# Patient Record
Sex: Female | Born: 1955 | Race: White | Hispanic: No | Marital: Married | Smoking: Former smoker
Health system: Southern US, Community
[De-identification: ages and names within clinical notes are randomized; demographics above are authoritative.]

## PROBLEM LIST (undated history)

## (undated) DIAGNOSIS — N83201 Unspecified ovarian cyst, right side: Secondary | ICD-10-CM

## (undated) DIAGNOSIS — I639 Cerebral infarction, unspecified: Secondary | ICD-10-CM

## (undated) DIAGNOSIS — K6389 Other specified diseases of intestine: Secondary | ICD-10-CM

## (undated) DIAGNOSIS — R202 Paresthesia of skin: Secondary | ICD-10-CM

## (undated) DIAGNOSIS — G459 Transient cerebral ischemic attack, unspecified: Secondary | ICD-10-CM

## (undated) DIAGNOSIS — T4145XA Adverse effect of unspecified anesthetic, initial encounter: Secondary | ICD-10-CM

## (undated) DIAGNOSIS — I10 Essential (primary) hypertension: Secondary | ICD-10-CM

## (undated) DIAGNOSIS — N83202 Unspecified ovarian cyst, left side: Secondary | ICD-10-CM

## (undated) DIAGNOSIS — T8859XA Other complications of anesthesia, initial encounter: Secondary | ICD-10-CM

## (undated) HISTORY — PX: OTHER SURGICAL HISTORY: SHX169

---

## 2004-09-11 ENCOUNTER — Emergency Department: Payer: Self-pay | Admitting: Emergency Medicine

## 2006-09-15 ENCOUNTER — Other Ambulatory Visit: Payer: Self-pay

## 2006-09-15 ENCOUNTER — Emergency Department: Payer: Self-pay | Admitting: General Practice

## 2008-07-06 ENCOUNTER — Emergency Department: Payer: Self-pay | Admitting: Emergency Medicine

## 2014-06-01 ENCOUNTER — Emergency Department: Payer: Self-pay | Admitting: Emergency Medicine

## 2014-06-01 LAB — CBC
HCT: 40.5 % (ref 35.0–47.0)
HGB: 13.7 g/dL (ref 12.0–16.0)
MCH: 28.4 pg (ref 26.0–34.0)
MCHC: 33.9 g/dL (ref 32.0–36.0)
MCV: 84 fL (ref 80–100)
Platelet: 195 10*3/uL (ref 150–440)
RBC: 4.82 10*6/uL (ref 3.80–5.20)
RDW: 15 % — AB (ref 11.5–14.5)
WBC: 5.6 10*3/uL (ref 3.6–11.0)

## 2014-06-01 LAB — URINALYSIS, COMPLETE
Bacteria: NONE SEEN
Bilirubin,UR: NEGATIVE
Blood: NEGATIVE
Glucose,UR: NEGATIVE mg/dL (ref 0–75)
Ketone: NEGATIVE
Leukocyte Esterase: NEGATIVE
NITRITE: NEGATIVE
Ph: 7 (ref 4.5–8.0)
Protein: NEGATIVE
RBC,UR: 1 /HPF (ref 0–5)
Specific Gravity: 1.009 (ref 1.003–1.030)
Squamous Epithelial: <1
WBC UR: <1 /HPF (ref 0–5)

## 2014-06-01 LAB — COMPREHENSIVE METABOLIC PANEL
Albumin: 3.6 g/dL (ref 3.4–5.0)
Alkaline Phosphatase: 87 U/L
Anion Gap: 8 (ref 7–16)
BILIRUBIN TOTAL: 0.8 mg/dL (ref 0.2–1.0)
BUN: 12 mg/dL (ref 7–18)
CO2: 26 mmol/L (ref 21–32)
Calcium, Total: 8.3 mg/dL — ABNORMAL LOW (ref 8.5–10.1)
Chloride: 107 mmol/L (ref 98–107)
Creatinine: 0.86 mg/dL (ref 0.60–1.30)
EGFR (Non-African Amer.): >60
Glucose: 89 mg/dL (ref 65–99)
OSMOLALITY: 280 (ref 275–301)
POTASSIUM: 3.9 mmol/L (ref 3.5–5.1)
SGOT(AST): 33 U/L (ref 15–37)
SGPT (ALT): 31 U/L
SODIUM: 141 mmol/L (ref 136–145)
Total Protein: 7.4 g/dL (ref 6.4–8.2)

## 2014-06-01 LAB — CK TOTAL AND CKMB (NOT AT ARMC)
CK, Total: 164 U/L (ref 26–192)
CK-MB: 1.9 ng/mL (ref 0.5–3.6)

## 2014-06-01 LAB — TROPONIN I: Troponin-I: <0.02 ng/mL

## 2015-06-09 DIAGNOSIS — I639 Cerebral infarction, unspecified: Secondary | ICD-10-CM

## 2015-06-09 DIAGNOSIS — G459 Transient cerebral ischemic attack, unspecified: Secondary | ICD-10-CM

## 2015-06-09 HISTORY — DX: Transient cerebral ischemic attack, unspecified: G45.9

## 2015-06-09 HISTORY — DX: Cerebral infarction, unspecified: I63.9

## 2015-07-09 ENCOUNTER — Encounter: Payer: Self-pay | Admitting: Emergency Medicine

## 2015-07-09 ENCOUNTER — Emergency Department
Admission: EM | Admit: 2015-07-09 | Discharge: 2015-07-09 | Disposition: A | Discharge status: Home / Self Care | Payer: Self-pay | Attending: Student | Admitting: Student

## 2015-07-09 ENCOUNTER — Emergency Department: Payer: Self-pay

## 2015-07-09 DIAGNOSIS — R1031 Right lower quadrant pain: Secondary | ICD-10-CM | POA: Insufficient documentation

## 2015-07-09 DIAGNOSIS — R1030 Lower abdominal pain, unspecified: Secondary | ICD-10-CM

## 2015-07-09 DIAGNOSIS — R197 Diarrhea, unspecified: Secondary | ICD-10-CM | POA: Insufficient documentation

## 2015-07-09 DIAGNOSIS — Z880 Allergy status to penicillin: Secondary | ICD-10-CM | POA: Insufficient documentation

## 2015-07-09 DIAGNOSIS — R102 Pelvic and perineal pain: Secondary | ICD-10-CM | POA: Insufficient documentation

## 2015-07-09 HISTORY — DX: Unspecified ovarian cyst, left side: N83.202

## 2015-07-09 HISTORY — DX: Unspecified ovarian cyst, right side: N83.201

## 2015-07-09 LAB — URINALYSIS COMPLETE WITH MICROSCOPIC (ARMC ONLY)
BILIRUBIN URINE: NEGATIVE
Bacteria, UA: NONE SEEN
Glucose, UA: NEGATIVE mg/dL
Hgb urine dipstick: NEGATIVE
Ketones, ur: NEGATIVE mg/dL
Leukocytes, UA: NEGATIVE
Nitrite: NEGATIVE
PH: 6 (ref 5.0–8.0)
PROTEIN: NEGATIVE mg/dL
Specific Gravity, Urine: 1.019 (ref 1.005–1.030)

## 2015-07-09 LAB — CBC
HCT: 43.3 % (ref 35.0–47.0)
Hemoglobin: 14.7 g/dL (ref 12.0–16.0)
MCH: 28 pg (ref 26.0–34.0)
MCHC: 34 g/dL (ref 32.0–36.0)
MCV: 82.2 fL (ref 80.0–100.0)
PLATELETS: 192 10*3/uL (ref 150–440)
RBC: 5.27 MIL/uL — AB (ref 3.80–5.20)
RDW: 14.9 % — AB (ref 11.5–14.5)
WBC: 5.5 10*3/uL (ref 3.6–11.0)

## 2015-07-09 LAB — COMPREHENSIVE METABOLIC PANEL
ALBUMIN: 4.4 g/dL (ref 3.5–5.0)
ALK PHOS: 78 U/L (ref 38–126)
ALT: 24 U/L (ref 14–54)
AST: 24 U/L (ref 15–41)
Anion gap: 7 (ref 5–15)
BUN: 15 mg/dL (ref 6–20)
CALCIUM: 9.5 mg/dL (ref 8.9–10.3)
CHLORIDE: 105 mmol/L (ref 101–111)
CO2: 27 mmol/L (ref 22–32)
CREATININE: 0.98 mg/dL (ref 0.44–1.00)
GFR calc Af Amer: >60 mL/min (ref >60)
GFR calc non Af Amer: >60 mL/min (ref >60)
GLUCOSE: 99 mg/dL (ref 65–99)
Potassium: 4 mmol/L (ref 3.5–5.1)
SODIUM: 139 mmol/L (ref 135–145)
Total Bilirubin: 1.1 mg/dL (ref 0.3–1.2)
Total Protein: 8 g/dL (ref 6.5–8.1)

## 2015-07-09 LAB — LIPASE, BLOOD: LIPASE: 31 U/L (ref 11–51)

## 2015-07-09 MED ORDER — IOHEXOL 240 MG/ML SOLN
25.0000 mL | Freq: Once | INTRAMUSCULAR | Status: AC | PRN
  Administered 2015-07-09: 25 mL via ORAL

## 2015-07-09 MED ORDER — IOHEXOL 300 MG/ML  SOLN
100.0000 mL | Freq: Once | INTRAMUSCULAR | Status: AC | PRN
  Administered 2015-07-09: 100 mL via INTRAVENOUS

## 2015-07-09 MED ORDER — SODIUM CHLORIDE 0.9 % IV BOLUS (SEPSIS)
500.0000 mL | Freq: Once | INTRAVENOUS | Status: AC
  Administered 2015-07-09: 500 mL via INTRAVENOUS

## 2015-07-09 NOTE — ED Notes (Signed)
Pt to ed with c/o right side lower abd pain, reports hx of cysts on ovaries,  States lower back pain and burning since Saturday.

## 2015-07-09 NOTE — ED Provider Notes (Signed)
[facility] Emergency Department Provider Note  ____________________________________________  Time seen: Approximately 11:20 AM  I have reviewed the triage vital signs and the nursing notes.   HISTORY  Chief Complaint Abdominal Pain    HPI Lindsay Schwartz is a 60 y.o. female with history of remote ovarian cyst and uterine fibroids who presents for evaluation of 3 days lower abdominal pain, worse in the right lower quadrant, constant since onset, gradual onset, currently moderate, no modifying factors. No nausea, vomiting or fevers. She has had multiple episodes of nonbloody nonbilious emesis. She is postmenopausal. No chest pain or difficulty breathing. She denies any abnormal vaginal bleeding or vaginal discharge. She denies any concerns for sexually transmitted infection.   Past Medical History  Diagnosis Date  . Cysts of both ovaries     There are no active problems to display for this patient.   History reviewed. No pertinent past surgical history.  No current outpatient prescriptions on file.  Allergies Penicillins  History reviewed. No pertinent family history.  Social History Social History  Substance Use Topics  . Smoking status: Never Smoker   . Smokeless tobacco: None  . Alcohol Use: No    Review of Systems Constitutional: No fever/chills Eyes: No visual changes. ENT: No sore throat. Cardiovascular: Denies chest pain. Respiratory: Denies shortness of breath. Gastrointestinal: + abdominal pain.  No nausea, no vomiting.  + diarrhea.  No constipation. Genitourinary: Negative for dysuria. Musculoskeletal: Negative for back pain. Skin: Negative for rash. Neurological: Negative for headaches, focal weakness or numbness.  10-point ROS otherwise negative.  ____________________________________________   PHYSICAL EXAM:  VITAL SIGNS: ED Triage Vitals  Enc Vitals Group     BP 07/09/15 0957 172/89 mmHg     Pulse Rate 07/09/15  0957 91     Resp 07/09/15 0957 20     Temp 07/09/15 0957 97.3 F (36.3 C)     Temp Source 07/09/15 0957 Oral     SpO2 07/09/15 0957 96 %     Weight 07/09/15 0957 180 lb (81.647 kg)     Height 07/09/15 0957 5\' 2"  (1.575 m)     Head Cir --      Peak Flow --      Pain Score 07/09/15 0958 4     Pain Loc --      Pain Edu? --      Excl. in [place]? --     Constitutional: Alert and oriented. Well appearing and in no acute distress. Eyes: Conjunctivae are normal. PERRL. EOMI. Head: Atraumatic. Nose: No congestion/rhinnorhea. Mouth/Throat: Mucous membranes are moist.  Oropharynx non-erythematous. Neck: No stridor.   Cardiovascular: Normal rate, regular rhythm. Grossly normal heart sounds.  Good peripheral circulation. Respiratory: Normal respiratory effort.  No retractions. Lungs CTAB. Gastrointestinal: Soft with mild to moderate tenderness in the lower abdomen, right greater than left. No CVA tenderness. Genitourinary: deferred Musculoskeletal: No lower extremity tenderness nor edema.  No joint effusions. Neurologic:  Normal speech and language. No gross focal neurologic deficits are appreciated. No gait instability. Skin:  Skin is warm, dry and intact. No rash noted. Psychiatric: Mood and affect are normal. Speech and behavior are normal.  ____________________________________________   LABS (all labs ordered are listed, but only abnormal results are displayed)  Labs Reviewed  CBC - Abnormal; Notable for the following:    RBC 5.27 (*)    RDW 14.9 (*)    All other components within normal limits  URINALYSIS COMPLETEWITH MICROSCOPIC ([facility] ONLY) - Abnormal; Notable  for the following:    Color, Urine YELLOW (*)    APPearance CLEAR (*)    Squamous Epithelial / LPF 0-5 (*)    All other components within normal limits  LIPASE, BLOOD  COMPREHENSIVE METABOLIC PANEL    ____________________________________________  EKG  none ____________________________________________  RADIOLOGY  CT abdomen and pelvis IMPRESSION: 1. A specific cause for the patient's acute right lower quadrant abdominal pain is not identified. The appendix is normal. Mobile cecum, right upper quadrant. 2. Lumbar spondylosis and degenerative disc disease, causing foraminal narrowing at L1-2, L4-5, and L5-S1 as detailed above. 3. Endometrial thickness 7 mm. In a postmenopausal female without vaginal bleeding this is considered within normal limits. The patient does have vaginal bleeding then this requires further workup by ultrasound. 4. Other imaging findings of potential clinical significance: Diffuse hepatic steatosis. Scarring at the right lung base. Bilateral renal atrophy is mildly advanced for age. Sigmoid diverticulosis without diverticulitis. Aortoiliac atherosclerotic vascular disease. ____________________________________________   PROCEDURES  Procedure(s) performed: None  Critical Care performed: No  ____________________________________________   INITIAL IMPRESSION / ASSESSMENT AND PLAN / ED COURSE  Pertinent labs & imaging results that were available during my care of the patient were reviewed by me and considered in my medical decision making (see chart for details).  Lindsay Schwartz is a 60 y.o. female with history of remote ovarian cyst and uterine fibroids who presents for evaluation of 3 days lower abdominal pain, worse in the right lower quadrant. On exam, she is nontoxic appearing and in no acute distress. Vital signs are stable, she is afebrile. She does have some tenderness throughout the lower abdomen, right greater than left. Plan for screening labs, CT of the abdomen and pelvis to rule out appendicitis. Given her well appearance and only mild tenderness on exam, doubt tubo-ovarian abscess or ovarian torsion. Reassess for disposition. She has  declined any pain medications at this time.  ----------------------------------------- 3:13 PM on 07/09/2015 ----------------------------------------- The patient continues to appear well, sitting up in bed, in no distress. No specific cause of the patient's abdominal pain found today on CT scan. Labs reviewed. CBC, CMP and lipase are generally unremarkable. Urinalysis is not consistent with urinary tract infection. We discussed extensive return precautions and need for close PCP and GYN follow-up. She is comfortable with the discharge plan. ____________________________________________   FINAL CLINICAL IMPRESSION(S) / ED DIAGNOSES  Final diagnoses:  Lower abdominal pain  Pelvic pain in female      Joanne Gavel, MD 07/09/15 1722

## 2015-10-09 ENCOUNTER — Other Ambulatory Visit: Payer: Self-pay | Admitting: Internal Medicine

## 2015-10-09 DIAGNOSIS — Z1239 Encounter for other screening for malignant neoplasm of breast: Secondary | ICD-10-CM

## 2015-10-09 DIAGNOSIS — R9431 Abnormal electrocardiogram [ECG] [EKG]: Secondary | ICD-10-CM | POA: Insufficient documentation

## 2015-10-09 DIAGNOSIS — R030 Elevated blood-pressure reading, without diagnosis of hypertension: Secondary | ICD-10-CM | POA: Insufficient documentation

## 2015-10-24 ENCOUNTER — Ambulatory Visit
Admission: RE | Admit: 2015-10-24 | Discharge: 2015-10-24 | Disposition: A | Discharge status: Home / Self Care | Payer: Self-pay | Source: Ambulatory Visit | Attending: Internal Medicine | Admitting: Internal Medicine

## 2015-10-24 ENCOUNTER — Other Ambulatory Visit: Payer: Self-pay | Admitting: Internal Medicine

## 2015-10-24 DIAGNOSIS — Z1239 Encounter for other screening for malignant neoplasm of breast: Secondary | ICD-10-CM

## 2015-10-27 ENCOUNTER — Emergency Department: Payer: Self-pay

## 2015-10-27 ENCOUNTER — Observation Stay
Admission: EM | Admit: 2015-10-27 | Discharge: 2015-10-28 | Disposition: A | Discharge status: Home / Self Care | Payer: Self-pay | Attending: Internal Medicine | Admitting: Internal Medicine

## 2015-10-27 ENCOUNTER — Observation Stay: Payer: Self-pay

## 2015-10-27 ENCOUNTER — Encounter: Payer: Self-pay | Admitting: Internal Medicine

## 2015-10-27 DIAGNOSIS — R6884 Jaw pain: Principal | ICD-10-CM | POA: Insufficient documentation

## 2015-10-27 DIAGNOSIS — R531 Weakness: Secondary | ICD-10-CM

## 2015-10-27 DIAGNOSIS — R5383 Other fatigue: Secondary | ICD-10-CM | POA: Insufficient documentation

## 2015-10-27 DIAGNOSIS — I639 Cerebral infarction, unspecified: Secondary | ICD-10-CM | POA: Diagnosis present

## 2015-10-27 DIAGNOSIS — Z803 Family history of malignant neoplasm of breast: Secondary | ICD-10-CM | POA: Insufficient documentation

## 2015-10-27 DIAGNOSIS — Z880 Allergy status to penicillin: Secondary | ICD-10-CM | POA: Insufficient documentation

## 2015-10-27 DIAGNOSIS — M6281 Muscle weakness (generalized): Secondary | ICD-10-CM | POA: Insufficient documentation

## 2015-10-27 DIAGNOSIS — I081 Rheumatic disorders of both mitral and tricuspid valves: Secondary | ICD-10-CM | POA: Insufficient documentation

## 2015-10-27 DIAGNOSIS — Z7982 Long term (current) use of aspirin: Secondary | ICD-10-CM | POA: Insufficient documentation

## 2015-10-27 DIAGNOSIS — Z8249 Family history of ischemic heart disease and other diseases of the circulatory system: Secondary | ICD-10-CM | POA: Insufficient documentation

## 2015-10-27 LAB — BASIC METABOLIC PANEL WITH GFR
Anion gap: 5 (ref 5–15)
BUN: 11 mg/dL (ref 6–20)
CO2: 26 mmol/L (ref 22–32)
Calcium: 8.9 mg/dL (ref 8.9–10.3)
Chloride: 107 mmol/L (ref 101–111)
Creatinine, Ser: 0.77 mg/dL (ref 0.44–1.00)
GFR calc Af Amer: >60 mL/min
GFR calc non Af Amer: >60 mL/min
Glucose, Bld: 111 mg/dL — ABNORMAL HIGH (ref 65–99)
Potassium: 4.2 mmol/L (ref 3.5–5.1)
Sodium: 138 mmol/L (ref 135–145)

## 2015-10-27 LAB — CBC
HCT: 43.3 % (ref 35.0–47.0)
Hemoglobin: 14.8 g/dL (ref 12.0–16.0)
MCH: 27.9 pg (ref 26.0–34.0)
MCHC: 34.3 g/dL (ref 32.0–36.0)
MCV: 81.4 fL (ref 80.0–100.0)
Platelets: 175 10*3/uL (ref 150–440)
RBC: 5.32 MIL/uL — ABNORMAL HIGH (ref 3.80–5.20)
RDW: 14.7 % — ABNORMAL HIGH (ref 11.5–14.5)
WBC: 4.5 10*3/uL (ref 3.6–11.0)

## 2015-10-27 LAB — TROPONIN I: Troponin I: <0.03 ng/mL

## 2015-10-27 MED ORDER — SODIUM CHLORIDE 0.9% FLUSH
3.0000 mL | Freq: Two times a day (BID) | INTRAVENOUS | Status: DC
  Administered 2015-10-27 – 2015-10-28 (×2): 3 mL via INTRAVENOUS

## 2015-10-27 MED ORDER — ENOXAPARIN SODIUM 40 MG/0.4ML ~~LOC~~ SOLN
40.0000 mg | SUBCUTANEOUS | Status: DC
  Filled 2015-10-27: qty 0.4

## 2015-10-27 MED ORDER — ONDANSETRON HCL 4 MG/2ML IJ SOLN
4.0000 mg | Freq: Four times a day (QID) | INTRAMUSCULAR | Status: DC | PRN
  Administered 2015-10-27: 22:00:00 4 mg via INTRAVENOUS
  Filled 2015-10-27 (×2): qty 2

## 2015-10-27 MED ORDER — ACETAMINOPHEN 325 MG PO TABS
650.0000 mg | ORAL_TABLET | Freq: Four times a day (QID) | ORAL | Status: DC | PRN
  Administered 2015-10-27 – 2015-10-28 (×2): 650 mg via ORAL
  Filled 2015-10-27 (×2): qty 2

## 2015-10-27 MED ORDER — ALBUTEROL SULFATE (2.5 MG/3ML) 0.083% IN NEBU: 2.5000 mg | INHALATION_SOLUTION | RESPIRATORY_TRACT | Status: DC | PRN

## 2015-10-27 MED ORDER — BISACODYL 5 MG PO TBEC: 5.0000 mg | DELAYED_RELEASE_TABLET | Freq: Every day | ORAL | Status: DC | PRN

## 2015-10-27 MED ORDER — POLYETHYLENE GLYCOL 3350 17 G PO PACK: 17.0000 g | PACK | Freq: Every day | ORAL | Status: DC | PRN

## 2015-10-27 MED ORDER — ASPIRIN 81 MG PO CHEW
324.0000 mg | CHEWABLE_TABLET | Freq: Once | ORAL | Status: AC
  Administered 2015-10-27: 19:00:00 324 mg via ORAL
  Filled 2015-10-27: qty 4

## 2015-10-27 MED ORDER — ACETAMINOPHEN 650 MG RE SUPP: 650.0000 mg | Freq: Four times a day (QID) | RECTAL | Status: DC | PRN

## 2015-10-27 MED ORDER — ONDANSETRON HCL 4 MG PO TABS: 4.0000 mg | ORAL_TABLET | Freq: Four times a day (QID) | ORAL | Status: DC | PRN

## 2015-10-27 MED ORDER — ATORVASTATIN CALCIUM 20 MG PO TABS: 40.0000 mg | ORAL_TABLET | Freq: Every day | ORAL | Status: DC

## 2015-10-27 MED ORDER — ASPIRIN EC 81 MG PO TBEC
81.0000 mg | DELAYED_RELEASE_TABLET | Freq: Every day | ORAL | Status: DC
  Administered 2015-10-28: 81 mg via ORAL
  Filled 2015-10-27: qty 1

## 2015-10-27 NOTE — H&P (Signed)
[place] [facility] [place] NAME: Lindsay Schwartz    MR#:  [id number]  DATE OF BIRTH:  1955/11/30  DATE OF ADMISSION:  10/27/2015  PRIMARY CARE PHYSICIAN: No PCP Per Patient   REQUESTING/REFERRING PHYSICIAN: Dr. Corky Downs  CHIEF COMPLAINT:   Chief Complaint  Patient presents with  . Jaw Pain    HISTORY OF PRESENT ILLNESS:  Lindsay Schwartz  is a 60 y.o. female with No significant past medical history presents to the emergency room complaining of feeling heaviness on her right side of the face and right upper extremity. Patient is very tearful on examination and initially mentioned that everything is okay and she wanted to go home. Later on further conversation she mentions that this started on waking up today morning. No dysphagia or change in vision. She has some tingling in her right arm. The main reason for presentation was concern for a heart attack. Patient examination had right sided weakness and had a CT scan of the head done which was normal and is being admitted to the hospital due to persistent right-sided weakness for further workup. She has been able to ambulate without any problem.  PAST MEDICAL HISTORY:   Past Medical History  Diagnosis Date  . Cysts of both ovaries     PAST SURGICAL HISTORY:  No past surgical history on file.  SOCIAL HISTORY:   Social History  Substance Use Topics  . Smoking status: Never Smoker   . Smokeless tobacco: Not on file  . Alcohol Use: No    FAMILY HISTORY:   Family History  Problem Relation Age of Onset  . Breast cancer Maternal Aunt 36  . CAD Father     DRUG ALLERGIES:   Allergies  Allergen Reactions  . Penicillins Rash    REVIEW OF SYSTEMS:   Review of Systems  Constitutional: Positive for malaise/fatigue. Negative for fever, chills and weight loss.  HENT: Negative for hearing loss and nosebleeds.   Eyes: Negative for blurred vision, double vision and pain.  Respiratory:  Negative for cough, hemoptysis, sputum production, shortness of breath and wheezing.   Cardiovascular: Negative for chest pain, palpitations, orthopnea and leg swelling.  Gastrointestinal: Negative for nausea, vomiting, abdominal pain, diarrhea and constipation.  Genitourinary: Negative for dysuria and hematuria.  Musculoskeletal: Negative for myalgias, back pain and falls.  Skin: Negative for rash.  Neurological: Positive for tingling and focal weakness. Negative for dizziness, tremors, sensory change, speech change, seizures and headaches.  Endo/Heme/Allergies: Does not bruise/bleed easily.  Psychiatric/Behavioral: Negative for depression and memory loss. The patient is nervous/anxious.     MEDICATIONS AT HOME:   Prior to Admission medications   Medication Sig Start Date End Date Taking? Authorizing Provider  aspirin EC 81 MG tablet Take 81 mg by mouth daily.   Yes Historical Provider, MD     VITAL SIGNS:  Blood pressure 138/86, pulse 74, temperature 98.3 F (36.8 C), temperature source Oral, resp. rate 16, height 5\' 3"  (1.6 m), weight 77.168 kg (170 lb 2 oz), SpO2 97 %.  PHYSICAL EXAMINATION:  Physical Exam  GENERAL:  60 y.o.-year-old patient lying in the bed with no acute distress. Anxious EYES: Pupils equal, round, reactive to light and accommodation. No scleral icterus. Extraocular muscles intact.  HEENT: Head atraumatic, normocephalic. Oropharynx and nasopharynx clear. No oropharyngeal erythema, moist oral mucosa  NECK:  Supple, no jugular venous distention. No thyroid enlargement, no tenderness.  LUNGS: Normal breath sounds bilaterally, no wheezing, rales, rhonchi. No  use of accessory muscles of respiration.  CARDIOVASCULAR: S1, S2 normal. No murmurs, rubs, or gallops.  ABDOMEN: Soft, nontender, nondistended. Bowel sounds present. No organomegaly or mass.  EXTREMITIES: No pedal edema, cyanosis, or clubbing. + 2 pedal & radial pulses b/l.   NEUROLOGIC: Cranial nerves II  through XII are intact. Sensations normal. Left sided upper and lower strength normal. Right upper extremity 4/5 and right lower extremity 4/5. Gait not tested. PSYCHIATRIC: The patient is alert and oriented x 3. Flat affect. Tearful. SKIN: No obvious rash, lesion, or ulcer.   LABORATORY PANEL:   CBC  Recent Labs Lab 10/27/15 1116  WBC 4.5  HGB 14.8  HCT 43.3  PLT 175   ------------------------------------------------------------------------------------------------------------------  Chemistries   Recent Labs Lab 10/27/15 1116  NA 138  K 4.2  CL 107  CO2 26  GLUCOSE 111*  BUN 11  CREATININE 0.77  CALCIUM 8.9   ------------------------------------------------------------------------------------------------------------------  Cardiac Enzymes  Recent Labs Lab 10/27/15 1116  TROPONINI <0.03   ------------------------------------------------------------------------------------------------------------------  RADIOLOGY:  Dg Chest 2 View  10/27/2015  CLINICAL DATA:  Pain. EXAM: CHEST  2 VIEW COMPARISON:  September 15, 2006 FINDINGS: The heart size is mildly enlarged. The hila and mediastinum are unremarkable. Vascular crowding again seen medial right lung base. No pulmonary nodules, masses, or suspicious infiltrates. No acute abnormalities are identified. IMPRESSION: No active cardiopulmonary disease. Electronically Signed   By: Dorise Bullion III M.D   On: 10/27/2015 12:20   Ct Head Wo Contrast  10/27/2015  CLINICAL DATA:  Neck/jaw pain/heaviness EXAM: CT HEAD WITHOUT CONTRAST TECHNIQUE: Contiguous axial images were obtained from the base of the skull through the vertex without intravenous contrast. COMPARISON:  09/12/2004 FINDINGS: No evidence of parenchymal hemorrhage or extra-axial fluid collection. No mass lesion, mass effect, or midline shift. No CT evidence of acute infarction. Cerebral volume is within normal limits.  No ventriculomegaly. The visualized paranasal sinuses  are essentially clear. The mastoid air cells are unopacified. No evidence of calvarial fracture. IMPRESSION: Normal head CT. Electronically Signed   By: Julian Hy M.D.   On: 10/27/2015 15:13     IMPRESSION AND PLAN:   * Right-sided weakness. Possible acute CVA Check MRI of the brain. Carotid Dopplers. Echocardiogram. Aspirin statin. PT, OT and speech evaluation. Patient has some labile weakness on repeated testing. Will need to consider psychiatry evaluation if her MRI is negative.  * Elevated blood pressure without diagnosis of hypertension Patient mentioned she has elevated blood pressure when she is stressed out. This is being followed by her primary care physician. Add medications if consistently elevated.  * DVT prophylaxis with Lovenox  All the records are reviewed and case discussed with ED provider. Management plans discussed with the patient, family and they are in agreement.  CODE STATUS: FULL CODE  TOTAL TIME TAKING CARE OF THIS PATIENT: 45 minutes.   Hillary Bow R M.D on 10/27/2015 at 4:30 PM  Between 7am to 6pm - Pager - [phone]  After 6pm go to [email or web address] - password [email or web address] [facility]  [place] [facility]  Office  [phone]  CC: Primary care physician; No PCP Per Patient  Note: This dictation was prepared with Dragon dictation along with smaller phrase technology. Any transcriptional errors that result from this process are unintentional.

## 2015-10-27 NOTE — ED Notes (Signed)
Patient transported to Ultrasound 

## 2015-10-27 NOTE — ED Notes (Addendum)
Pt states she woke up this morning not feeling right states her PCP has been working her up for "silent Heart attack" Pt states after she woke up the pain progressed in her Jaw, and pain in neck Right arm heaviness and tingling in the tips of her right fingers. Grips intact speech clear. Strength equal.

## 2015-10-27 NOTE — ED Provider Notes (Signed)
[facility] Emergency Department Provider Note  ____________________________________________    I have reviewed the triage vital signs and the nursing notes.   HISTORY  Chief Complaint Jaw Pain    HPI Lindsay Schwartz is a 60 y.o. female who presents with a complaint of right-sided jaw "heaviness" as well as right arm  "heaviness" that she noted when she woke up this morning. She complains of a mild frontal headache. She denies chest pain. She denies a history of similar complaints. Normal vision. She also complains of mild tingling in the fingertips of the right hand     Past Medical History  Diagnosis Date  . Cysts of both ovaries     There are no active problems to display for this patient.   No past surgical history on file.  No current outpatient prescriptions on file.  Allergies Penicillins  Family History  Problem Relation Age of Onset  . Breast cancer Maternal Aunt 58    Social History Social History  Substance Use Topics  . Smoking status: Never Smoker   . Smokeless tobacco: Not on file  . Alcohol Use: No    Review of Systems  Constitutional: Negative for fever. Eyes: Negative for redness ENT: Negative for sore throat Cardiovascular: Negative for chest pain Respiratory: Negative for shortness of breath. Gastrointestinal: Negative for abdominal pain Genitourinary: Negative for dysuria. Musculoskeletal: Negative for back pain. Skin: Negative for rash. Neurological: As above Psychiatric: no anxiety    ____________________________________________   PHYSICAL EXAM:  VITAL SIGNS: ED Triage Vitals  Enc Vitals Group     BP 10/27/15 1101 175/84 mmHg     Pulse Rate 10/27/15 1101 71     Resp 10/27/15 1101 20     Temp 10/27/15 1101 98.3 F (36.8 C)     Temp Source 10/27/15 1101 Oral     SpO2 10/27/15 1101 97 %     Weight 10/27/15 1101 170 lb 2 oz (77.168 kg)     Height 10/27/15 1101 5\' 3"  (1.6 m)     Head Cir --       Peak Flow --      Pain Score 10/27/15 1112 5     Pain Loc --      Pain Edu? --      Excl. in [place]? --      Constitutional: Alert and oriented. Well appearing and in no distress.  Eyes: Conjunctivae are normal. No erythema or injection ENT   Head: Normocephalic and atraumatic.   Mouth/Throat: Mucous membranes are moist. Cardiovascular: Normal rate, regular rhythm. Normal and symmetric distal pulses are present in the upper extremities.  Respiratory: Normal respiratory effort without tachypnea nor retractions. Breath sounds are clear and equal bilaterally.  Gastrointestinal: Soft and non-tender in all quadrants. No distention. There is no CVA tenderness. Genitourinary: deferred Musculoskeletal: Nontender with normal range of motion in all extremities. No lower extremity tenderness nor edema. Neurologic:  Normal speech and language. Patient with slight decrease in strength in the right arm as well as possibly in the right leg. EOMI, PERRLA Skin:  Skin is warm, dry and intact. No rash noted. Psychiatric: Mood and affect are normal. Patient exhibits appropriate insight and judgment.  ____________________________________________    LABS (pertinent positives/negatives)  Labs Reviewed  CBC - Abnormal; Notable for the following:    RBC 5.32 (*)    RDW 14.7 (*)    All other components within normal limits  BASIC METABOLIC PANEL - Abnormal; Notable for the following:  Glucose, Bld 111 (*)    All other components within normal limits  TROPONIN I    ____________________________________________   EKG  ED ECG REPORT I, Lavonia Drafts, the attending physician, personally viewed and interpreted this ECG.  Date: 10/27/2015 EKG Time: 11:07 AM Rate: 70 Rhythm: normal sinus rhythm QRS Axis: normal Intervals: normal ST/T Wave abnormalities: normal Conduction Disturbances: Left anterior fascicular block    ____________________________________________    RADIOLOGY  CT  head normal Chest x-ray is normal  ____________________________________________   PROCEDURES  Procedure(s) performed: none  Critical Care performed:none  ____________________________________________   INITIAL IMPRESSION / ASSESSMENT AND PLAN / ED COURSE  Pertinent labs & imaging results that were available during my care of the patient were reviewed by me and considered in my medical decision making (see chart for details).  Patient presents with complaints of discomfort in her jaw and "heaviness in her entire body" but primarily in the right arm. No chest pain. EKG is unremarkable, troponin is normal. Initial concern was more for possible CVA. Given description of heaviness we will order CT head and reevaluate  ----------------------------------------- 3:54 PM on 10/27/2015 -----------------------------------------  CT scan is reassuring, but the patient does have weakness of the right arm. I will discuss with hospitalist for admission ____________________________________________   FINAL CLINICAL IMPRESSION(S) / ED DIAGNOSES  CVA       Lavonia Drafts, MD 10/27/15 1556

## 2015-10-28 ENCOUNTER — Observation Stay: Payer: MEDICAID

## 2015-10-28 ENCOUNTER — Observation Stay
Admit: 2015-10-28 | Discharge: 2015-10-28 | Disposition: A | Discharge status: Home / Self Care | Payer: MEDICAID | Attending: Internal Medicine | Admitting: Internal Medicine

## 2015-10-28 DIAGNOSIS — G459 Transient cerebral ischemic attack, unspecified: Secondary | ICD-10-CM

## 2015-10-28 LAB — LIPID PANEL
CHOL/HDL RATIO: 7.4 ratio
Cholesterol: 207 mg/dL — ABNORMAL HIGH (ref 0–200)
HDL: 28 mg/dL — ABNORMAL LOW (ref >40)
LDL CALC: 135 mg/dL — AB (ref 0–99)
TRIGLYCERIDES: 221 mg/dL — AB (ref <150)
VLDL: 44 mg/dL — AB (ref 0–40)

## 2015-10-28 LAB — ECHOCARDIOGRAM COMPLETE
Height: 63 in
WEIGHTICAEL: 2699.2 [oz_av]

## 2015-10-28 LAB — HEMOGLOBIN A1C: Hgb A1c MFr Bld: 5.2 % (ref 4.0–6.0)

## 2015-10-28 MED ORDER — ATORVASTATIN CALCIUM 40 MG PO TABS: 40.0000 mg | ORAL_TABLET | Freq: Every day | ORAL | Status: DC

## 2015-10-28 MED ORDER — CLOPIDOGREL BISULFATE 75 MG PO TABS: 75.0000 mg | ORAL_TABLET | Freq: Every day | ORAL | Status: DC

## 2015-10-28 MED ORDER — METOCLOPRAMIDE HCL 5 MG/ML IJ SOLN
5.0000 mg | Freq: Once | INTRAMUSCULAR | Status: AC
Start: 2015-10-28 — End: 2015-10-28
  Administered 2015-10-28: 11:00:00 5 mg via INTRAVENOUS
  Filled 2015-10-28: qty 2

## 2015-10-28 NOTE — Evaluation (Signed)
Physical Therapy Evaluation Patient Details Name: Lindsay Schwartz MRN: [id number] DOB: 04/25/1956 Today's Date: 10/28/2015   History of Present Illness  Lindsay Schwartz is an 60 y.o. female who reports that she was feeling fatigued on Saturday and went to bed early. When she awakened on Sunday she noted a feeling of heaviness on her right face and arm. When she was drinking fluids came out of the right side of her mouth. She presented for evaluation at that time. 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  Pt's first husband died of a massive CVA.  CT and MRI of head negative.     Clinical Impression  Prior to admission, pt was independent ambulating without AD.  Pt lives with her husband on main floor of home with stairs to enter and stairs down to basement to use the dryer.  Pt initially stood and became very nauseas and needing to sit down (pt also c/o increased tingling R jaw but decreased with rest; nursing notified and came to see pt and cleared PT to ambulate with pt).  Pt able to ambulate around nursing loop but reported increased nausea with distance and became teary when PT attempting to decrease UE support (pt given B hand hold assist to steady d/t pt fearful of falling; unable to assess RW d/t pt too nauseas post ambulation to try to walk again).  Pt would benefit from skilled PT to address noted impairments and functional limitations.  Recommend pt discharge to home with 24/7 assist (anticipate pt will benefit from RW for stability) and HHPT when medically appropriate.     Follow Up Recommendations Home health PT;Supervision/Assistance - 24 hour    Equipment Recommendations  Rolling walker with 5" wheels    Recommendations for Other Services       Precautions / Restrictions Precautions Precautions:  Fall Restrictions Weight Bearing Restrictions: No      Mobility  Bed Mobility Overal bed mobility: Modified Independent Bed Mobility: Supine to Sit;Sit to Supine     Supine to sit: Modified independent (Device/Increase time);HOB elevated Sit to supine: Modified independent (Device/Increase time);HOB elevated      Transfers Overall transfer level: Needs assistance Equipment used: None Transfers: Sit to/from Stand Sit to Stand: Min guard         General transfer comment: x3 repetitions; pt holding onto counter upon standing for balance d/t concern for falling  Ambulation/Gait Ambulation/Gait assistance: Min guard;Min assist;+2 physical assistance;+2 safety/equipment Ambulation Distance (Feet): 220 Feet Assistive device: 2 person hand held assist   Gait velocity: decreased   General Gait Details: mild decreased stance time R LE; mild decreased L step length; attempted 1 person hand held assist x40 feet but pt became tearful so changed back to 2 person hand held assist d/t pt's concerns of falling  Stairs            Wheelchair Mobility    Modified Rankin (Stroke Patients Only)       Balance Overall balance assessment: Needs assistance Sitting-balance support: No upper extremity supported;Feet supported Sitting balance-Leahy Scale: Normal     Standing balance support: Single extremity supported Standing balance-Leahy Scale: Fair Standing balance comment: pt reporting concerns of falling  Pertinent Vitals/Pain Pain Assessment: 0-10 Pain Score: 5  Pain Location: headache Pain Descriptors / Indicators: Aching;Headache;Constant Pain Intervention(s): Limited activity within patient's tolerance;Monitored during session;Repositioned    Home Living Family/patient expects to be discharged to:: Private residence Living Arrangements: Spouse/significant other Available Help at Discharge: Family Type of Home: House Home  Access: Stairs to enter Entrance Stairs-Rails: Right Entrance Stairs-Number of Steps: 4 Home Layout: Two level (frequently goes to basement) Home Equipment: Shower seat;Grab bars - tub/shower;Walker - 2 wheels      Prior Function Level of Independence: Independent         Comments: Independent and active in community church and helping her mother and husband; denies any falls in past 6 months     Hand Dominance   Dominant Hand: Right    Extremity/Trunk Assessment   Upper Extremity Assessment: Defer to OT evaluation RUE Deficits / Details: speed slower on R and 5-/5 for strength but has full use of UE for ADLs and sensation and proprioception intact         Lower Extremity Assessment: RLE deficits/detail;LLE deficits/detail RLE Deficits / Details: R hip flexion 4/5; R knee extension 4+/5; R knee flexion 4/5; R DF 4+/5 LLE Deficits / Details: L LE strength and ROM WFL  Cervical / Trunk Assessment: Normal  Communication   Communication: No difficulties  Cognition Arousal/Alertness: Awake/alert Behavior During Therapy: WFL for tasks assessed/performed;Flat affect (tearful at times) Overall Cognitive Status: Within Functional Limits for tasks assessed                      General Comments  Pt agreeable to PT session.  Pt's husband and daughter present during session.    Exercises        Assessment/Plan    PT Assessment Patient needs continued PT services  PT Diagnosis Difficulty walking   PT Problem List Decreased strength;Decreased activity tolerance;Decreased balance;Decreased mobility;Decreased knowledge of use of DME;Impaired sensation  PT Treatment Interventions DME instruction;Gait training;Stair training;Functional mobility training;Therapeutic activities;Therapeutic exercise;Balance training;Neuromuscular re-education;Patient/family education   PT Goals (Current goals can be found in the Care Plan section) Acute Rehab PT Goals Patient Stated Goal:  to go home PT Goal Formulation: With patient Time For Goal Achievement: 11/11/15 Potential to Achieve Goals: Good    Frequency Min 2X/week   Barriers to discharge        Co-evaluation               End of Session Equipment Utilized During Treatment: Gait belt Activity Tolerance: Other (comment) (Limited d/t nausea with activity) Patient left: in bed;with call bell/phone within reach (with SLP present) Nurse Communication: Mobility status;Precautions         Time: [id number] PT Time Calculation (min) (ACUTE ONLY): 28 min   Charges:   PT Evaluation $PT Eval Moderate Complexity: 1 Procedure PT Treatments $Therapeutic Activity: 8-22 mins   PT G CodesLeitha Bleak 11-09-15, 2:04 PM Leitha Bleak, [place]

## 2015-10-28 NOTE — Progress Notes (Signed)
Orders received, chart reviewed and nursing consulted. Pt interviewed and appears at cognitive baseline with intelligible speech. Pt has very mild right facial weakness that doesn't impact PO consumption or speech intelligibility. Pt states that she compensates by placing food on her left when eating secondary to "right jaw feeling heavy." No pocketing of food noted. Education provided to pt, husband and daughter who are present as well as nursing. Skilled ST services are not indicated at this time. Nursing can reconsult ST if pt condition changes during hospitalization.

## 2015-10-28 NOTE — Progress Notes (Signed)
*  PRELIMINARY RESULTS* Echocardiogram 2D Echocardiogram has been performed.  Lindsay Schwartz 10/28/2015, 8:32 AM

## 2015-10-28 NOTE — Discharge Instructions (Signed)
Cardiac diet  Regular activity  You will need follow up labs now that you are on Lipitor. Your Primary doctor can order these tests.

## 2015-10-28 NOTE — Progress Notes (Signed)
Pt is alert and oriented x 4, c/o headache improved with tylenol, nausea improved with reglan, family at bedside, vitals stable, d/c to home with recommendations to follow up with outpatient physical therapy. Pt has follow up appointment with Dr. Candiss Norse and advised to follow up with neurologist, Dr. Manuella Ghazi. Contact information provided to patient for Dr. Manuella Ghazi. Rx sent to pharmacy, pt will be on aspirin and atorvastatin. Pt reports understanding of d/c instructions and has no further questions at this time, d/c via husband, pushed to visitor entrance via nursing staff.

## 2015-10-28 NOTE — Care Management (Signed)
Admitted to [facility] under observation status with the diagnosis of CVA. Lives with the husband in [place]. Will be seeing Dr. Glendon Axe at [facility] June 2. States that she has no income and no insurance. States she has a rolling walker in the home. States she takes no prescription medications. No home Health. No skilled facility.  Discharge to home today per Dr. Darvin Neighbours.   Family will transport Lindsay Ammons RN MSN CCM Care Management [phone]

## 2015-10-28 NOTE — Evaluation (Signed)
Occupational Therapy Evaluation Patient Details Name: Lindsay Schwartz MRN: [id number] DOB: [birth year] Today's Date: 10/28/2015    History of Present Illness Lindsay Schwartz is an 60 y.o. female who reports that she was feeling fatigued on Saturday and went to bed early. When she awakened on Sunday she noted a feeling of heaviness on her right face and arm. When she was drinking fluids came out of the right side of her mouth. She presented for evaluation at that time. 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  Her step daughter was present and indicated that she needed to slow down and not do so much for everyone else. Pt's first husband died of a massive CVA.    Clinical Impression   Pt seen for OT evaluation with several family members in room with her and her Doristine Bosworth.  She was feeling nauseated and had received meds vis IV to help with this from [place].  Pt is able to ambulate to bathroom with supervision but has fear of falling and her daughter reported that she has her mother put her arms up around her shoulders and walk behind her.  PT gave this therapist a walker to try with patient but she was too anxious to try to ambulate again and asked to try later after lunch which was conveyed to PT.  She presents at baseline for ADLs and has decreased strength of 5-/5 in RUE with mild delay in fine motor skills but is able to complete ADLs without assistance.  Rec pt use RUE as much as possible and not ignore it like she has been doing when tingling is present.  MRI and CT negative.  Pt is not in need of any further OT and reviewed pacing and energy conservation tech to use during the day and allow rest breaks.  Pt presented with flat affect and tearful at times during the evaluation.       Follow Up Recommendations  No OT follow up    Equipment  Recommendations       Recommendations for Other Services       Precautions / Restrictions Precautions Precautions: Fall (family indicated she has been getting up on her own--will clarify with NSG) Restrictions Weight Bearing Restrictions: No      Mobility Bed Mobility                  Transfers                      Balance                                            ADL Overall ADL's : At baseline                                       General ADL Comments: Pt is fearful of falling but is able to complete all ADLs as she did prior to admission.  Supervision for safety only.       Vision     Perception     Praxis      Pertinent Vitals/Pain Pain Assessment:  0-10 Pain Score: 6  Pain Location: headache Pain Descriptors / Indicators: Aching;Constant Pain Intervention(s): Limited activity within patient's tolerance;Premedicated before session;Monitored during session     Hand Dominance Right   Extremity/Trunk Assessment Upper Extremity Assessment Upper Extremity Assessment: RUE deficits/detail RUE Deficits / Details: speed slower on R and 5-/5 for strength but has full use of UE for ADLs and sensation and proprioception intact RUE Coordination: decreased fine motor (gave verbal exercises to do at home and encouraged her to use it as much as possible for ADLs)   Lower Extremity Assessment Lower Extremity Assessment: Defer to PT evaluation       Communication Communication Communication: No difficulties   Cognition Arousal/Alertness: Awake/alert Behavior During Therapy: WFL for tasks assessed/performed;Flat affect (weeping at times) Overall Cognitive Status: Within Functional Limits for tasks assessed                     General Comments       Exercises       Shoulder Instructions      Home Living Family/patient expects to be discharged to:: Private residence Living Arrangements:  Spouse/significant other Available Help at Discharge: Family Type of Home: House       Home Layout: One level     Bathroom Shower/Tub: Tub/shower unit;Walk-in shower (has both and her husband has a grab bar with shower chair with back and grab bar in his shower stall wihich is rec) Shower/tub characteristics: Architectural technologist: Standard     Home Equipment: Shower seat;Grab bars - tub/shower          Prior Functioning/Environment Level of Independence: Independent        Comments: independent and active in [employer] and helping her mother and husband    OT Diagnosis:     OT Problem List:     OT Treatment/Interventions:      OT Goals(Current goals can be found in the care plan section)    OT Frequency:     Barriers to D/C:            Co-evaluation              End of Session    Activity Tolerance: Patient tolerated treatment well Patient left: in bed;with family/visitor present   Time: 1120-1155 OT Time Calculation (min): 35 min Charges:  OT General Charges $OT Visit: 1 Procedure OT Evaluation $OT Eval Moderate Complexity: 1 Procedure G-Codes: OT G-codes **NOT FOR INPATIENT CLASS** Functional Limitation: Self care Self Care Current Status [id number]): 0 percent impaired, limited or restricted Self Care Goal Status [id number]): 0 percent impaired, limited or restricted   Chrys Racer, OTR/L ascom [phone]  10/28/2015, 12:42 PM

## 2015-10-28 NOTE — Consult Note (Signed)
Referring Physician: Sudini    Chief Complaint: Right sided numbness and weakness  HPI: Lindsay Schwartz is an 60 y.o. female 2ho reports that she was feeling fatigued on Saturday and went to bed early.  When she awakened on Sunday she noted a feeling of heaviness on her right face and arm.  When she was drinking fluids came out of the right side of her mouth.  She presented for evaluation at that time.  Initial NIHSS of 3.    Date last known well: 10/26/2015 Time last known well: Time: 22:00 tPA Given: No: Outside time window  MRankin: 0  Past Medical History  Diagnosis Date  . Cysts of both ovaries     History reviewed. No pertinent past surgical history.  Family History  Problem Relation Age of Onset  . Breast cancer Maternal Aunt 42  . CAD Father    Social History:  reports that she has never smoked. She does not have any smokeless tobacco history on file. She reports that she does not drink alcohol or use illicit drugs.  Allergies:  Allergies  Allergen Reactions  . Penicillins Rash    Medications:  I have reviewed the patient's current medications. Prior to Admission:  Prescriptions prior to admission  Medication Sig Dispense Refill Last Dose  . aspirin EC 81 MG tablet Take 81 mg by mouth daily.   10/26/2015 at Unknown time   Scheduled: . aspirin EC  81 mg Oral Daily  . atorvastatin  40 mg Oral q1800  . enoxaparin (LOVENOX) injection  40 mg Subcutaneous Q24H  . sodium chloride flush  3 mL Intravenous Q12H    ROS: History obtained from the patient  General ROS: negative for - chills, fatigue, fever, night sweats, weight gain or weight loss Psychological ROS: negative for - behavioral disorder, hallucinations, memory difficulties, mood swings or suicidal ideation Ophthalmic ROS: negative for - blurry vision, double vision, eye pain or loss of vision ENT ROS: negative for - epistaxis, nasal discharge, oral lesions, sore throat, tinnitus or vertigo Allergy and  Immunology ROS: negative for - hives or itchy/watery eyes Hematological and Lymphatic ROS: negative for - bleeding problems, bruising or swollen lymph nodes Endocrine ROS: negative for - galactorrhea, hair pattern changes, polydipsia/polyuria or temperature intolerance Respiratory ROS: negative for - cough, hemoptysis, shortness of breath or wheezing Cardiovascular ROS: negative for - chest pain, dyspnea on exertion, edema or irregular heartbeat Gastrointestinal ROS: nausea Genito-Urinary ROS: negative for - dysuria, hematuria, incontinence or urinary frequency/urgency Musculoskeletal ROS: negative for - joint swelling or muscular weakness Neurological ROS: as noted in HPI Dermatological ROS: negative for rash and skin lesion changes  Physical Examination: Blood pressure 116/62, pulse 67, temperature 97.4 F (36.3 C), temperature source Oral, resp. rate 18, height 5\' 3"  (1.6 m), weight 76.522 kg (168 lb 11.2 oz), SpO2 98 %.  HEENT-  Normocephalic, no lesions, without obvious abnormality.  Normal external eye and conjunctiva.  Normal TM's bilaterally.  Normal auditory canals and external ears. Normal external nose, mucus membranes and septum.  Normal pharynx. Cardiovascular- S1, S2 normal, pulses palpable throughout   Lungs- chest clear, no wheezing, rales, normal symmetric air entry Abdomen- soft, non-tender; bowel sounds normal; no masses,  no organomegaly Extremities- no edema Lymph-no adenopathy palpable Musculoskeletal-no joint tenderness, deformity or swelling Skin-warm and dry, no hyperpigmentation, vitiligo, or suspicious lesions  Neurological Examination Mental Status: Alert, oriented, thought content appropriate.  Speech fluent without evidence of aphasia.  Able to follow 3 step commands without  difficulty. Cranial Nerves: II: Discs flat bilaterally; Visual fields grossly normal, pupils equal, round, reactive to light and accommodation III,IV, VI: ptosis not present,  extra-ocular motions intact bilaterally V,VII: facial asymmetry, facial light touch sensation decreased on the right VIII: hearing normal bilaterally IX,X: gag reflex present XI: bilateral shoulder shrug XII: midline tongue extension Motor: Right : Upper extremity   5-/5 Drift with no pronation   Left:     Upper extremity   5/5  Lower extremity   5-/5       Lower extremity   5/5 Tone and bulk:normal tone throughout; no atrophy noted.  Patient gives weakness bilaterally when arms and legs are tested together and is unable to provide resistance.  With continued testing patient gives more weakness on the right.   Sensory: Pinprick and light touch decreased on the right including the trunk Deep Tendon Reflexes: 2+ and symmetric throughout Plantars: Right: downgoing   Left: downgoing Cerebellar: Normal finger-to-nose and normal heel-to-shin testing bilaterally Gait: not tested due to safety concerns   Laboratory Studies:  Basic Metabolic Panel:  Recent Labs Lab 10/27/15 1116  NA 138  K 4.2  CL 107  CO2 26  GLUCOSE 111*  BUN 11  CREATININE 0.77  CALCIUM 8.9    Liver Function Tests: No results for input(s): AST, ALT, ALKPHOS, BILITOT, PROT, ALBUMIN in the last 168 hours. No results for input(s): LIPASE, AMYLASE in the last 168 hours. No results for input(s): AMMONIA in the last 168 hours.  CBC:  Recent Labs Lab 10/27/15 1116  WBC 4.5  HGB 14.8  HCT 43.3  MCV 81.4  PLT 175    Cardiac Enzymes:  Recent Labs Lab 10/27/15 1116  TROPONINI <0.03    BNP: Invalid input(s): POCBNP  CBG: No results for input(s): GLUCAP in the last 168 hours.  Microbiology: No results found for this or any previous visit.  Coagulation Studies: No results for input(s): LABPROT, INR in the last 72 hours.  Urinalysis: No results for input(s): COLORURINE, LABSPEC, PHURINE, GLUCOSEU, HGBUR, BILIRUBINUR, KETONESUR, PROTEINUR, UROBILINOGEN, NITRITE, LEUKOCYTESUR in the last 168  hours.  Invalid input(s): APPERANCEUR  Lipid Panel:    Component Value Date/Time   CHOL 207* 10/28/2015 0517   TRIG 221* 10/28/2015 0517   HDL 28* 10/28/2015 0517   CHOLHDL 7.4 10/28/2015 0517   VLDL 44* 10/28/2015 0517   LDLCALC 135* 10/28/2015 0517    HgbA1C: No results found for: HGBA1C  Urine Drug Screen:  No results found for: LABOPIA, COCAINSCRNUR, LABBENZ, AMPHETMU, THCU, LABBARB  Alcohol Level: No results for input(s): ETH in the last 168 hours.  Other results: EKG: normal sinus rhythm at 70 bpm, LAFB.  Imaging: Dg Chest 2 View  10/27/2015  CLINICAL DATA:  Pain. EXAM: CHEST  2 VIEW COMPARISON:  September 15, 2006 FINDINGS: The heart size is mildly enlarged. The hila and mediastinum are unremarkable. Vascular crowding again seen medial right lung base. No pulmonary nodules, masses, or suspicious infiltrates. No acute abnormalities are identified. IMPRESSION: No active cardiopulmonary disease. Electronically Signed   By: Dorise Bullion III M.D   On: 10/27/2015 12:20   Ct Head Wo Contrast  10/27/2015  CLINICAL DATA:  Neck/jaw pain/heaviness EXAM: CT HEAD WITHOUT CONTRAST TECHNIQUE: Contiguous axial images were obtained from the base of the skull through the vertex without intravenous contrast. COMPARISON:  09/12/2004 FINDINGS: No evidence of parenchymal hemorrhage or extra-axial fluid collection. No mass lesion, mass effect, or midline shift. No CT evidence of acute infarction. Cerebral volume  is within normal limits.  No ventriculomegaly. The visualized paranasal sinuses are essentially clear. The mastoid air cells are unopacified. No evidence of calvarial fracture. IMPRESSION: Normal head CT. Electronically Signed   By: Julian Hy M.D.   On: 10/27/2015 15:13   Mr Brain Wo Contrast  10/28/2015  CLINICAL DATA:  Right-sided weakness. EXAM: MRI HEAD WITHOUT CONTRAST TECHNIQUE: Multiplanar, multiecho pulse sequences of the brain and surrounding structures were obtained without  intravenous contrast. COMPARISON:  Head CT from yesterday FINDINGS: Calvarium and upper cervical spine: No focal marrow signal abnormality. Orbits: Negative. Sinuses and Mastoids: Clear. Brain: No acute abnormality such as acute infarct, hemorrhage, hydrocephalus, or mass lesion. No evidence of large vessel occlusion. Few FLAIR hyperintensities in the cerebral white matter are age congruent. No signs of demyelination. IMPRESSION: Normal exam.  No explanation for weakness. Electronically Signed   By: Monte Fantasia M.D.   On: 10/28/2015 09:17   US Carotid Bilateral  10/27/2015  CLINICAL DATA:  60 year old female with symptoms of stroke EXAM: BILATERAL CAROTID DUPLEX ULTRASOUND TECHNIQUE: Pearline Cables scale imaging, color Doppler and duplex ultrasound were performed of bilateral carotid and vertebral arteries in the neck. COMPARISON:  CT scan of the head 10/27/2015 FINDINGS: Criteria: Quantification of carotid stenosis is based on velocity parameters that correlate the residual internal carotid diameter with NASCET-based stenosis levels, using the diameter of the distal internal carotid lumen as the denominator for stenosis measurement. The following velocity measurements were obtained: RIGHT ICA:  104/35 cm/sec CCA:  [id number] cm/sec SYSTOLIC ICA/CCA RATIO:  1.1 DIASTOLIC ICA/CCA RATIO:  1.8 ECA:  105 cm/sec LEFT ICA:  123/44 cm/sec CCA:  [id number] cm/sec SYSTOLIC ICA/CCA RATIO:  1.6 DIASTOLIC ICA/CCA RATIO:  2.2 ECA:  73 cm/sec RIGHT CAROTID ARTERY: No significant atherosclerotic plaque or evidence of stenosis. RIGHT VERTEBRAL ARTERY:  Patent with normal antegrade flow. LEFT CAROTID ARTERY: No significant atherosclerotic plaque or evidence of stenosis. LEFT VERTEBRAL ARTERY:  Patent with normal antegrade flow. IMPRESSION: Negative bilateral carotid duplex ultrasound. Signed, Criselda Peaches, MD [facility] Electronically Signed   By: Jacqulynn Cadet M.D.   On:  10/27/2015 18:10    Assessment: 60 y.o. female presenting with mild right sided numbness and weakness.  MRI of the brain personally reviewed and shows no acute changes.  Minimal chronic changes as well.  Concern is for a MR negative stroke or TIA.  Patient repots that she has been hypertensive at home.  Patient on ASA at home.  Carotid dopplers show no evidence of hemodynamically significant stenosis.  Echocardiogram results pending.  A1c pending, LDL 135.  Stroke Risk Factors - hyperlipidemia and hypertension  Plan: 1. HgbA1c and echocardiogram pending 2. PT consult, OT consult, Speech consult 3. Prophylactic therapy-Antiplatelet med: Plavix - dose 75mg  daily 4. Telemetry monitoring 5. Frequent neuro checks    Alexis Goodell, MD Neurology [phone] 10/28/2015, 10:56 AM

## 2015-10-31 NOTE — Discharge Summary (Signed)
Lindsay Schwartz NAME: Lindsay Schwartz    MR#:  [id number]  DATE OF BIRTH:  31-Jul-1955  DATE OF ADMISSION:  10/27/2015 ADMITTING PHYSICIAN: Hillary Bow, MD  DATE OF DISCHARGE: 10/28/2015  4:53 PM  PRIMARY CARE PHYSICIAN: No PCP Per Patient   ADMISSION DIAGNOSIS:  CVA (cerebral infarction) [I63.9] Cerebrovascular accident (CVA), unspecified mechanism ([place]) [I63.9]  DISCHARGE DIAGNOSIS:  Active Problems:   CVA (cerebral infarction)   SECONDARY DIAGNOSIS:   Past Medical History  Diagnosis Date  . Cysts of both ovaries      ADMITTING HISTORY  Lindsay Schwartz is a 60 y.o. female with No significant past medical history presents to the emergency room complaining of feeling heaviness on her right side of the face and right upper extremity. Patient is very tearful on examination and initially mentioned that everything is okay and she wanted to go home. Later on further conversation she mentions that this started on waking up today morning. No dysphagia or change in vision. She has some tingling in her right arm. The main reason for presentation was concern for a heart attack. Patient examination had right sided weakness and had a CT scan of the head done which was normal and is being admitted to the hospital due to persistent right-sided weakness for further workup. She has been able to ambulate without any problem.  HOSPITAL COURSE:   Patient was admitted onto medical floor with telemetry monitoring due to concern for TIA or stroke. Patient had variable weakness on repeated examination. Had an MRI of the brain done which showed no acute abnormalities. Echocardiogram and carotid Dopplers were normal. Patient was started on Plavix and statin as per recommendations of neurology Dr. Doy Mince. Outpatient follow-up with neurology. It is unclear if patient had TIA or if this is more of a conversion disorder.  Discussed with patient in  detail on day of discharge regarding the need for a Plavix and statin. She is adamant she would like natural products and lifestyle changes for risk modification. However advised her to go on a cardiac diet along with regular exercise but also take medications as recommended by neurology. Advised that she will need CK and liver function tests labs with her primary care physician for monitoring now that she is on statin.  Discharged home in a stable condition.  CONSULTS OBTAINED:  Treatment Team:  Alexis Goodell, MD  DRUG ALLERGIES:   Allergies  Allergen Reactions  . Penicillins Rash    DISCHARGE MEDICATIONS:   Discharge Medication List as of 10/28/2015  4:32 PM    START taking these medications   Details  atorvastatin (LIPITOR) 40 MG tablet Take 1 tablet (40 mg total) by mouth daily at 6 PM., Starting 10/28/2015, Until Discontinued, Normal    clopidogrel (PLAVIX) 75 MG tablet Take 1 tablet (75 mg total) by mouth daily., Starting 10/28/2015, Until Discontinued, Normal      STOP taking these medications     aspirin EC 81 MG tablet         Today   VITAL SIGNS:  Blood pressure 141/70, pulse 68, temperature 97.5 F (36.4 C), temperature source Oral, resp. rate 18, height 5\' 3"  (1.6 m), weight 76.522 kg (168 lb 11.2 oz), SpO2 96 %.  I/O:  No intake or output data in the 24 hours ending 10/31/15 1640  PHYSICAL EXAMINATION:  Physical Exam  GENERAL:  60 y.o.-year-old patient lying in the bed with no acute distress.  LUNGS: Normal  breath sounds bilaterally, no wheezing, rales,rhonchi or crepitation. No use of accessory muscles of respiration.  CARDIOVASCULAR: S1, S2 normal. No murmurs, rubs, or gallops.  ABDOMEN: Soft, non-tender, non-distended. Bowel sounds present. No organomegaly or mass.  NEUROLOGIC: Moves all 4 extremities. PSYCHIATRIC: The patient is alert and oriented x 3.  SKIN: No obvious rash, lesion, or ulcer.   DATA REVIEW:   CBC  Recent Labs Lab  10/27/15 1116  WBC 4.5  HGB 14.8  HCT 43.3  PLT 175    Chemistries   Recent Labs Lab 10/27/15 1116  NA 138  K 4.2  CL 107  CO2 26  GLUCOSE 111*  BUN 11  CREATININE 0.77  CALCIUM 8.9    Cardiac Enzymes  Recent Labs Lab 10/27/15 1116  Elfrida <0.03    Microbiology Results  No results found for this or any previous visit.  RADIOLOGY:  No results found.  Follow up with PCP in 1 week.  Management plans discussed with the patient, family and they are in agreement.  CODE STATUS:  Code Status History    Date Active Date Inactive Code Status Order ID Comments User Context   10/27/2015  4:28 PM 10/28/2015  7:57 PM Full Code [id number]  Hillary Bow, MD ED      TOTAL TIME TAKING CARE OF THIS PATIENT ON DAY OF DISCHARGE: more than 30 minutes.   Hillary Bow R M.D on 10/31/2015 at 4:40 PM  Between 7am to 6pm - Pager - [phone]  After 6pm go to [email or web address] - password [email or web address] [facility]  [facility]  Office  [phone]  CC: Primary care physician; No PCP Per Patient  Note: This dictation was prepared with Dragon dictation along with smaller phrase technology. Any transcriptional errors that result from this process are unintentional.

## 2015-12-20 DIAGNOSIS — E782 Mixed hyperlipidemia: Secondary | ICD-10-CM | POA: Insufficient documentation

## 2015-12-20 DIAGNOSIS — Z8673 Personal history of transient ischemic attack (TIA), and cerebral infarction without residual deficits: Secondary | ICD-10-CM | POA: Insufficient documentation

## 2015-12-20 DIAGNOSIS — R030 Elevated blood-pressure reading, without diagnosis of hypertension: Secondary | ICD-10-CM | POA: Insufficient documentation

## 2017-05-24 ENCOUNTER — Other Ambulatory Visit: Payer: Self-pay

## 2017-05-24 ENCOUNTER — Encounter: Payer: Self-pay | Admitting: Emergency Medicine

## 2017-05-24 ENCOUNTER — Emergency Department: Payer: BLUE CROSS/BLUE SHIELD

## 2017-05-24 ENCOUNTER — Emergency Department
Admission: EM | Admit: 2017-05-24 | Discharge: 2017-05-24 | Disposition: A | Discharge status: Home / Self Care | Payer: BLUE CROSS/BLUE SHIELD | Attending: Emergency Medicine | Admitting: Emergency Medicine

## 2017-05-24 DIAGNOSIS — R030 Elevated blood-pressure reading, without diagnosis of hypertension: Secondary | ICD-10-CM | POA: Diagnosis present

## 2017-05-24 DIAGNOSIS — I10 Essential (primary) hypertension: Secondary | ICD-10-CM

## 2017-05-24 DIAGNOSIS — Z882 Allergy status to sulfonamides status: Secondary | ICD-10-CM | POA: Insufficient documentation

## 2017-05-24 DIAGNOSIS — Z880 Allergy status to penicillin: Secondary | ICD-10-CM | POA: Insufficient documentation

## 2017-05-24 DIAGNOSIS — Z79899 Other long term (current) drug therapy: Secondary | ICD-10-CM | POA: Insufficient documentation

## 2017-05-24 DIAGNOSIS — Z87891 Personal history of nicotine dependence: Secondary | ICD-10-CM | POA: Insufficient documentation

## 2017-05-24 DIAGNOSIS — R0789 Other chest pain: Secondary | ICD-10-CM | POA: Diagnosis not present

## 2017-05-24 DIAGNOSIS — Z7982 Long term (current) use of aspirin: Secondary | ICD-10-CM | POA: Insufficient documentation

## 2017-05-24 LAB — BASIC METABOLIC PANEL
Anion gap: 9 (ref 5–15)
BUN: 11 mg/dL (ref 6–20)
CHLORIDE: 105 mmol/L (ref 101–111)
CO2: 25 mmol/L (ref 22–32)
CREATININE: 0.68 mg/dL (ref 0.44–1.00)
Calcium: 9.1 mg/dL (ref 8.9–10.3)
GFR calc Af Amer: >60 mL/min (ref >60)
GFR calc non Af Amer: >60 mL/min (ref >60)
GLUCOSE: 106 mg/dL — AB (ref 65–99)
Potassium: 4 mmol/L (ref 3.5–5.1)
SODIUM: 139 mmol/L (ref 135–145)

## 2017-05-24 LAB — CBC
HCT: 45 % (ref 35.0–47.0)
Hemoglobin: 15.1 g/dL (ref 12.0–16.0)
MCH: 28.1 pg (ref 26.0–34.0)
MCHC: 33.5 g/dL (ref 32.0–36.0)
MCV: 83.7 fL (ref 80.0–100.0)
PLATELETS: 225 10*3/uL (ref 150–440)
RBC: 5.38 MIL/uL — ABNORMAL HIGH (ref 3.80–5.20)
RDW: 14.3 % (ref 11.5–14.5)
WBC: 5.5 10*3/uL (ref 3.6–11.0)

## 2017-05-24 LAB — TROPONIN I
Troponin I: <0.03 ng/mL (ref <0.03)
Troponin I: <0.03 ng/mL (ref <0.03)

## 2017-05-24 MED ORDER — AMLODIPINE BESYLATE 5 MG PO TABS: 5.0000 mg | ORAL_TABLET | Freq: Every day | ORAL | 0 refills | Status: DC

## 2017-05-24 NOTE — Discharge Instructions (Signed)
Follow-up with your primary care doctor within the next week.  Take the blood pressure medication as prescribed until you follow-up with the doctor and then your regular doctor can decide whether to keep you on the same medication or to make any changes.  Return to the emergency department for new or worsening chest pain or difficulty breathing, headache, weakness, or any other new or worsening symptoms that concern you.

## 2017-05-24 NOTE — ED Triage Notes (Signed)
Pt presents with high blood pressure. States she was feeling "funny" in her jaw, and had a headache and pain between her shoulder blades, and she took her bp and it was over 459 systolic. Pt states she has had hx of of 140's but has never been on bp meds before. Pt alert & oriented with NAD noted.

## 2017-05-24 NOTE — ED Notes (Signed)
Pt upset at discharge. Pushed call bell to urinate. Sec called me. I was with a pt that passed out in hallway and told sec. I could not help pt. Per pt she wet self because no one came. I apologized. Pt then said that she had a tia in the past and we did nothing about that, no test, nothing. Pt had told me repeatedly that she was here for hypertension. Not chest pain, not anything else and at one time asked me if she should stay for 2nd troponin test. Again, I apologized to pt and she left.

## 2017-05-24 NOTE — ED Provider Notes (Signed)
[facility] Emergency Department Provider Note ____________________________________________   First MD Initiated Contact with Patient 05/24/17 1511     (approximate)  I have reviewed the triage vital signs and the nursing notes.   HISTORY  Chief Complaint Chest Pain    HPI Lindsay Schwartz is a 61 y.o. female with past medical history of CVA and apparent history of hypertension who presents with primary complaint of elevated blood pressure, measured to approximately 215/90, and associated with discomfort to her chest and her back between her shoulder blades, as well as generally "not feeling right."  Symptoms started earlier today, and when she was at the pharmacy began medication for her husband she had her blood pressure checked and it was found to be elevated.  Patient states that approximate 1 year ago her blood pressure was also noted to be elevated when she had it checked at her doctor's office, but at that time she elected not to start on an antihypertensive and was taking garlic pills.  Patient denies severe headache, difficulty breathing, extremity swelling, or other acute symptoms.  Past Medical History:  Diagnosis Date  . Cysts of both ovaries     Patient Active Problem List   Diagnosis Date Noted  . CVA (cerebral infarction) 10/27/2015    History reviewed. No pertinent surgical history.  Prior to Admission medications   Medication Sig Start Date End Date Taking? Authorizing Provider  aspirin EC 81 MG tablet Take 1 tablet by mouth daily.   Yes [provider]  amLODipine (NORVASC) 5 MG tablet Take 1 tablet (5 mg total) by mouth daily. 05/24/17 06/23/17  Arta Silence, MD  atorvastatin (LIPITOR) 40 MG tablet Take 1 tablet (40 mg total) by mouth daily at 6 PM. Patient not taking: Reported on 05/24/2017 10/28/15   Hillary Bow, MD  clopidogrel (PLAVIX) 75 MG tablet Take 1 tablet (75 mg total) by mouth daily. Patient not taking:  Reported on 05/24/2017 10/28/15   Hillary Bow, MD    Allergies Codeine; Sulfur; and Penicillins  Family History  Problem Relation Age of Onset  . CAD Father   . Breast cancer Maternal Aunt 47    Social History Social History   Tobacco Use  . Smoking status: Former Research scientist (life sciences)  . Smokeless tobacco: Never Used  Substance Use Topics  . Alcohol use: No    Alcohol/week: 0.0 oz  . Drug use: No    Review of Systems  Constitutional: No fever. Eyes: No visual changes. ENT: No neck pain. Cardiovascular: Denies chest pain. Respiratory: Denies shortness of breath. Gastrointestinal: No nausea, no vomiting.   Genitourinary: Negative for dysuria.  Musculoskeletal: Negative for back pain. Skin: Negative for rash. Neurological: Positive for mild headache.   ____________________________________________   PHYSICAL EXAM:  VITAL SIGNS: ED Triage Vitals  Enc Vitals Group     BP 05/24/17 1441 (!) 214/93     Pulse Rate 05/24/17 1441 87     Resp 05/24/17 1441 18     Temp 05/24/17 1441 98 F (36.7 C)     Temp Source 05/24/17 1441 Oral     SpO2 05/24/17 1441 100 %     Weight 05/24/17 1441 180 lb (81.6 kg)     Height 05/24/17 1441 5\' 2"  (1.575 m)     Head Circumference --      Peak Flow --      Pain Score 05/24/17 1439 4     Pain Loc --      Pain Edu? --  Excl. in [place]? --     Constitutional: Alert and oriented. Well appearing and in no acute distress. Eyes: Conjunctivae are normal.  Head: Atraumatic. Nose: No congestion/rhinnorhea. Mouth/Throat: Mucous membranes are moist.   Neck: Normal range of motion.  Cardiovascular: Normal rate, regular rhythm. Grossly normal heart sounds.  Good peripheral circulation. Respiratory: Normal respiratory effort.  No retractions. Lungs CTAB. Gastrointestinal: Soft and nontender. No distention.  Genitourinary: No CVA tenderness. Musculoskeletal: No lower extremity edema.  Extremities warm and well perfused.  Neurologic:  Normal speech and  language. No gross focal neurologic deficits are appreciated.  Skin:  Skin is warm and dry. No rash noted. Psychiatric: Mood and affect are normal. Speech and behavior are normal.  ____________________________________________   LABS (all labs ordered are listed, but only abnormal results are displayed)  Labs Reviewed  BASIC METABOLIC PANEL - Abnormal; Notable for the following components:      Result Value   Glucose, Bld 106 (*)    All other components within normal limits  CBC - Abnormal; Notable for the following components:   RBC 5.38 (*)    All other components within normal limits  TROPONIN I  TROPONIN I   ____________________________________________  EKG  ED ECG REPORT I, Arta Silence, the attending physician, personally viewed and interpreted this ECG.  Date: 05/24/2017 EKG Time: 1435 Rate: 83 Rhythm: normal sinus rhythm QRS Axis: normal Intervals: normal ST/T Wave abnormalities: Nonspecific T wave flattening Narrative Interpretation: no evidence of acute ischemia; no significant change when compared to EKG of 10/29/2015  ____________________________________________  RADIOLOGY  CXR: no focal infiltrate or pulmonary edema  ____________________________________________   PROCEDURES  Procedure(s) performed: No    Critical Care performed: No ____________________________________________   INITIAL IMPRESSION / ASSESSMENT AND PLAN / ED COURSE  Pertinent labs & imaging results that were available during my care of the patient were reviewed by me and considered in my medical decision making (see chart for details).  61 year old female with past medical history as noted above presents with hypertension as well as chest and back discomfort since earlier today.  Onset of the hypertension itself is unknown, but she says it was previously high when measured at her doctor 1 year ago.  Patient is not currently on an antihypertensive.  Past medical records  reviewed in Epic and are noncontributory.  On exam, vital signs are normal except for initial hypertension though at the time of my exam her blood pressure was 160s over 90s.  The remainder the exam is unremarkable.  Hesitation is consistent with essential hypertension, and I have low suspicion for ACS given the minimal and nonspecific chest and back discomfort.  There is no evidence of aortic dissection or other vascular cause.  Plan for cardiac enzymes x2, basic labs, and given that patient has had likely long-standing untreated hypertension will discharge with antihypertensive and plan to follow-up with primary care.    ----------------------------------------- 6:41 PM on 05/24/2017 -----------------------------------------  Patient's repeat troponin is negative.  She is very upset because she did not have anyone come over when she needs to get unhooked from her monitor to urinate.  Blood pressure has remained stable.  She states that she would like to leave now.  I explained the results of the test to the patient, verify that she had a primary care doctor to follow-up with, and explained the prescription.  I also gave the patient return precautions, and she expressed understanding.  She is safe for discharge home at this time.  ____________________________________________   FINAL CLINICAL IMPRESSION(S) / ED DIAGNOSES  Final diagnoses:  Hypertension, unspecified type  Atypical chest pain      NEW MEDICATIONS STARTED DURING THIS VISIT:  This SmartLink is deprecated. Use AVSMEDLIST instead to display the medication list for a patient.   Note:  This document was prepared using Dragon voice recognition software and may include unintentional dictation errors.    Arta Silence, MD 05/24/17 [phone]

## 2017-06-08 DIAGNOSIS — R202 Paresthesia of skin: Secondary | ICD-10-CM

## 2017-06-08 HISTORY — DX: Paresthesia of skin: R20.2

## 2017-07-07 DIAGNOSIS — R739 Hyperglycemia, unspecified: Secondary | ICD-10-CM | POA: Insufficient documentation

## 2017-07-07 DIAGNOSIS — I10 Essential (primary) hypertension: Secondary | ICD-10-CM | POA: Insufficient documentation

## 2017-07-09 ENCOUNTER — Other Ambulatory Visit: Payer: Self-pay | Admitting: Internal Medicine

## 2017-07-09 DIAGNOSIS — R05 Cough: Secondary | ICD-10-CM

## 2017-07-09 DIAGNOSIS — R0609 Other forms of dyspnea: Principal | ICD-10-CM

## 2017-07-09 DIAGNOSIS — R053 Chronic cough: Secondary | ICD-10-CM

## 2017-07-26 ENCOUNTER — Ambulatory Visit
Admission: RE | Admit: 2017-07-26 | Discharge: 2017-07-26 | Disposition: A | Discharge status: Home / Self Care | Payer: BLUE CROSS/BLUE SHIELD | Source: Ambulatory Visit | Attending: Internal Medicine | Admitting: Internal Medicine

## 2017-07-26 DIAGNOSIS — K449 Diaphragmatic hernia without obstruction or gangrene: Secondary | ICD-10-CM | POA: Insufficient documentation

## 2017-07-26 DIAGNOSIS — R0609 Other forms of dyspnea: Secondary | ICD-10-CM

## 2017-07-26 DIAGNOSIS — R05 Cough: Secondary | ICD-10-CM

## 2017-07-26 DIAGNOSIS — R918 Other nonspecific abnormal finding of lung field: Secondary | ICD-10-CM | POA: Diagnosis present

## 2017-07-26 DIAGNOSIS — R053 Chronic cough: Secondary | ICD-10-CM

## 2017-07-26 DIAGNOSIS — I700 Atherosclerosis of aorta: Secondary | ICD-10-CM | POA: Diagnosis not present

## 2017-07-27 ENCOUNTER — Other Ambulatory Visit: Payer: Self-pay | Admitting: Internal Medicine

## 2017-07-27 DIAGNOSIS — R918 Other nonspecific abnormal finding of lung field: Secondary | ICD-10-CM

## 2017-08-06 DIAGNOSIS — K6389 Other specified diseases of intestine: Secondary | ICD-10-CM

## 2017-08-06 HISTORY — DX: Other specified diseases of intestine: K63.89

## 2017-08-11 ENCOUNTER — Encounter
Admission: RE | Admit: 2017-08-11 | Discharge: 2017-08-11 | Disposition: A | Discharge status: Home / Self Care | Payer: BLUE CROSS/BLUE SHIELD | Source: Ambulatory Visit | Attending: Internal Medicine | Admitting: Internal Medicine

## 2017-08-11 DIAGNOSIS — R918 Other nonspecific abnormal finding of lung field: Secondary | ICD-10-CM

## 2017-08-11 LAB — GLUCOSE, CAPILLARY: Glucose-Capillary: 111 mg/dL — ABNORMAL HIGH (ref 65–99)

## 2017-08-11 MED ORDER — FLUDEOXYGLUCOSE F - 18 (FDG) INJECTION
8.8100 | Freq: Once | INTRAVENOUS | Status: AC | PRN
  Administered 2017-08-11: 8.81 via INTRAVENOUS

## 2017-08-17 ENCOUNTER — Other Ambulatory Visit: Payer: Self-pay

## 2017-08-20 ENCOUNTER — Telehealth: Payer: Self-pay

## 2017-08-20 ENCOUNTER — Encounter: Payer: Self-pay | Admitting: Cardiothoracic Surgery

## 2017-08-20 ENCOUNTER — Ambulatory Visit (INDEPENDENT_AMBULATORY_CARE_PROVIDER_SITE_OTHER): Payer: BLUE CROSS/BLUE SHIELD | Admitting: Cardiothoracic Surgery

## 2017-08-20 VITALS — BP 139/84 | HR 83 | Temp 97.6°F | Ht >= 80 in | Wt 173.6 lb

## 2017-08-20 DIAGNOSIS — R918 Other nonspecific abnormal finding of lung field: Secondary | ICD-10-CM | POA: Diagnosis not present

## 2017-08-20 NOTE — Progress Notes (Signed)
Patient ID: Lindsay Schwartz, female   DOB: 1955/06/29, 62 y.o.   MRN: [id number]  Chief Complaint  Patient presents with  . New Patient (Initial Visit)    Lung Mass-referred Dr.Sparks    Referred By Dr. Fulton Reek Reason for Referral right middle and right lower lobe nodules  HPI Location, Quality, Duration, Severity, Timing, Context, Modifying Factors, Associated Signs and Symptoms.  Lindsay Schwartz is a 62 y.o. female.  In December 2018 the patient states that she had taken her blood pressure and noticed that it was quite high.  She states that it was over 200 mmHg and she presented to the emergency department.  After considerable weight in the emergency department and a chest x-ray that was read as normal she left the emergency department without therapy.  Over the course of the next few weeks she began to desire a different physician and she saw Dr. Doy Hutching attention.  She complained of some chest discomfort and a cough which have been going on for over a year.  The patient then had a CT scan of the chest which demonstrated a right middle lobe nodule measuring about 1.2 cm and a right lower lobe nodule measuring about 8 mm.  A third nodule was identified in the right lower lobe.  This measured 3-4 mm.  The patient then had a PET scan done which revealed mild uptake in the larger lung nodules.  There was intense uptake in the colon and the rectosigmoid region.  The patient is set up to have a colonoscopy by Dr. Vira Agar within the next few weeks.  She does have a history of rectal bleeding which she has attributed to hemorrhoids.  She has a limited smoking history smoking for about 3 or 4 years but quit over 30 years ago.  She has had a carpal tunnel surgery many years ago.  She also was hospitalized back in 2017 with a possible TIA but had a negative carotid duplex done at that time.   Past Medical History:  Diagnosis Date  . Cysts of both ovaries     History reviewed. No pertinent  surgical history.  Family History  Problem Relation Age of Onset  . CAD Father   . Breast cancer Maternal Aunt 84    Social History Social History   Tobacco Use  . Smoking status: Former Research scientist (life sciences)  . Smokeless tobacco: Never Used  Substance Use Topics  . Alcohol use: No    Alcohol/week: 0.0 oz  . Drug use: No    Allergies  Allergen Reactions  . Codeine   . Sulfur   . Penicillins Rash    Has patient had a PCN reaction causing immediate rash, facial/tongue/throat swelling, SOB or lightheadedness with hypotension: Yes Has patient had a PCN reaction causing severe rash involving mucus membranes or skin necrosis: No Has patient had a PCN reaction that required hospitalization: No Has patient had a PCN reaction occurring within the last 10 years: No If all of the above answers are "NO", then may proceed with Cephalosporin use.    Current Outpatient Medications  Medication Sig Dispense Refill  . amitriptyline (ELAVIL) 25 MG tablet Take by mouth.    Marland Kitchen aspirin EC 81 MG tablet Take 1 tablet by mouth daily.     No current facility-administered medications for this visit.       Review of Systems A complete review of systems was asked and was negative except for the following positive findings bruising, cough, blood in her  stools.  Blood pressure 139/84, pulse 83, temperature 97.6 F (36.4 C), temperature source Oral, height 7\' 5"  (2.261 m), weight 173 lb 9.6 oz (78.7 kg).  Physical Exam CONSTITUTIONAL:  Pleasant, well-developed, well-nourished, and in no acute distress. EYES: Pupils equal and reactive to light, Sclera non-icteric EARS, NOSE, MOUTH AND THROAT:  The oropharynx was clear.  Dentition is good repair.  Oral mucosa pink and moist. LYMPH NODES:  Lymph nodes in the neck and axillae were normal RESPIRATORY:  Lungs were clear.  Normal respiratory effort without pathologic use of accessory muscles of respiration CARDIOVASCULAR: Heart was regular without murmurs.  There  were no carotid bruits. GI: The abdomen was soft, nontender, and nondistended. There were no palpable masses. There was no hepatosplenomegaly. There were normal bowel sounds in all quadrants. GU:  Rectal deferred.   MUSCULOSKELETAL:  Normal muscle strength and tone.  No clubbing or cyanosis.   SKIN:  There were no pathologic skin lesions.  There were no nodules on palpation. NEUROLOGIC:  Sensation is normal.  Cranial nerves are grossly intact. PSYCH:  Oriented to person, place and time.  Mood and affect are normal.  Data Reviewed CT scan and PET scan  I have personally reviewed the patient's imaging, laboratory findings and medical records.    Assessment    There are 2 possibly 3 right lung nodules.  They are consistent with either primary lung cancers or metastatic disease.    Plan    I had a long discussion with the patient and her family today.  The lung nodules have mild activity on PET but would be consistent with malignancies.  They also may be inflammatory in nature as well.  The patient states that she has had recent rectal bleeding and her PET scan suggests there may be a lesion in her colon.  This raises the possibility that these nodules could be metastatic.  I explained to her that it would be important to have the results of the colonoscopy as soon as possible as this will dictate whether we work her up for metastatic colon cancer or primary lung cancer.  We will try to have have Dr. Vira Agar facilitate her colonoscopy.  We will also get some pulmonary function studies.  Will be back in touch with her once the colonoscopy is complete.        Nestor Lewandowsky, MD 08/20/2017, 8:50 AM

## 2017-08-20 NOTE — Patient Instructions (Signed)
I will call Dr.Elliot's office to see if the Colonoscopy can be moved up.  I will arrange the appointments for you to have the breathing studies and the CT guided biopsy and call you later today or Monday 08/23/17 and give you the appointment information.   Please call our office if you have not heard from me by Monday.

## 2017-08-20 NOTE — Addendum Note (Signed)
Addended by: Wayna Chalet on: 08/20/2017 12:11 PM   Modules accepted: Orders

## 2017-08-20 NOTE — Telephone Encounter (Signed)
Called Dr. Percell Boston office to let them know that the patient needs to be seen sooner than next month. I was told by the receptionist that she needed to send a message to the nurse so they could evaluate and hopefully reschedule soon. I will fax all of our paperwork so they could review. They will contact the patient once they reschedule her appointment with Dr. Vira Agar. I was told that the patient needed an appointment prior to having her Colonoscopy done.

## 2017-08-20 NOTE — Telephone Encounter (Signed)
Called Lindsay Schwartz from [facility] to let her know that we needed a CT Guided Biopsy scheduled and a PFT. Awaiting for her call.

## 2017-08-23 NOTE — Telephone Encounter (Signed)
Patient's CT Guided Lung Biopsy will be done on 09/02/2017 at 9:30 Am but arrival at 9:00 AM and PFT's will be on 08/26/2017 at 1:30 PM and arrival at 1:15 PM. Patient is instructed not to drink any caffeine and no bronchial dilators.

## 2017-08-24 NOTE — Telephone Encounter (Signed)
Patient will be seen at Dr. Percell Boston office on 08/26/2017.

## 2017-08-24 NOTE — Telephone Encounter (Signed)
Patient has called back. I have went over all appointments with her.  3/21 @ 9:15 with Dr Macon Large visit to schedule colonoscopy that is needed prior to CT guided Bx.  3/21 @ 1:15 for PFT's--[facility] to [facility]-No caffeine or bronchial dilators prior to appointment.   3/28 @ 9:00 for CT guided lung biopsy if needed determining the results of the colonoscopy.   4/5 @ 9:30 with Dr Genevive Bi to discuss all results-PFT's, Lung Bx.   Patient was advised to call our office after her visit with Dr Tiffany Kocher to inform the nurse of the day the colonoscopy will be scheduled to determine if we will need to change the date of the CT guided bx-lung.

## 2017-08-24 NOTE — Telephone Encounter (Signed)
Called patient and left her a voicemail letting her know that I needed to speak to her so I could give her appointment information.

## 2017-08-26 ENCOUNTER — Encounter: Payer: Self-pay | Admitting: *Deleted

## 2017-08-26 ENCOUNTER — Ambulatory Visit: Payer: BLUE CROSS/BLUE SHIELD | Attending: Cardiothoracic Surgery

## 2017-08-26 DIAGNOSIS — R918 Other nonspecific abnormal finding of lung field: Secondary | ICD-10-CM

## 2017-08-26 LAB — BLOOD GAS, ARTERIAL
Acid-Base Excess: 2.3 mmol/L — ABNORMAL HIGH (ref 0.0–2.0)
Bicarbonate: 26.5 mmol/L (ref 20.0–28.0)
FIO2: 0.21
O2 Saturation: 95.9 %
PH ART: 7.44 (ref 7.350–7.450)
Patient temperature: 37
pCO2 arterial: 39 mmHg (ref 32.0–48.0)
pO2, Arterial: 78 mmHg — ABNORMAL LOW (ref 83.0–108.0)

## 2017-08-27 ENCOUNTER — Encounter: Payer: Self-pay | Admitting: *Deleted

## 2017-08-27 ENCOUNTER — Ambulatory Visit: Payer: BLUE CROSS/BLUE SHIELD | Admitting: Certified Registered"

## 2017-08-27 ENCOUNTER — Encounter
Admission: RE | Disposition: A | Discharge status: Home / Self Care | Payer: Self-pay | Source: Ambulatory Visit | Attending: Gastroenterology

## 2017-08-27 ENCOUNTER — Ambulatory Visit
Admission: RE | Admit: 2017-08-27 | Discharge: 2017-08-27 | Disposition: A | Discharge status: Home / Self Care | Payer: BLUE CROSS/BLUE SHIELD | Source: Ambulatory Visit | Attending: Gastroenterology | Admitting: Gastroenterology

## 2017-08-27 DIAGNOSIS — Z880 Allergy status to penicillin: Secondary | ICD-10-CM | POA: Diagnosis not present

## 2017-08-27 DIAGNOSIS — Z888 Allergy status to other drugs, medicaments and biological substances status: Secondary | ICD-10-CM | POA: Insufficient documentation

## 2017-08-27 DIAGNOSIS — D126 Benign neoplasm of colon, unspecified: Secondary | ICD-10-CM | POA: Diagnosis not present

## 2017-08-27 DIAGNOSIS — K5730 Diverticulosis of large intestine without perforation or abscess without bleeding: Secondary | ICD-10-CM | POA: Insufficient documentation

## 2017-08-27 DIAGNOSIS — I10 Essential (primary) hypertension: Secondary | ICD-10-CM | POA: Insufficient documentation

## 2017-08-27 DIAGNOSIS — K644 Residual hemorrhoidal skin tags: Secondary | ICD-10-CM | POA: Insufficient documentation

## 2017-08-27 DIAGNOSIS — Z7982 Long term (current) use of aspirin: Secondary | ICD-10-CM | POA: Diagnosis not present

## 2017-08-27 DIAGNOSIS — Z885 Allergy status to narcotic agent status: Secondary | ICD-10-CM | POA: Insufficient documentation

## 2017-08-27 DIAGNOSIS — K2210 Ulcer of esophagus without bleeding: Secondary | ICD-10-CM | POA: Insufficient documentation

## 2017-08-27 DIAGNOSIS — R05 Cough: Secondary | ICD-10-CM | POA: Insufficient documentation

## 2017-08-27 DIAGNOSIS — K210 Gastro-esophageal reflux disease with esophagitis: Secondary | ICD-10-CM | POA: Diagnosis not present

## 2017-08-27 DIAGNOSIS — D123 Benign neoplasm of transverse colon: Secondary | ICD-10-CM | POA: Diagnosis not present

## 2017-08-27 DIAGNOSIS — K3189 Other diseases of stomach and duodenum: Secondary | ICD-10-CM | POA: Diagnosis not present

## 2017-08-27 DIAGNOSIS — R933 Abnormal findings on diagnostic imaging of other parts of digestive tract: Secondary | ICD-10-CM | POA: Diagnosis present

## 2017-08-27 DIAGNOSIS — Z79899 Other long term (current) drug therapy: Secondary | ICD-10-CM | POA: Diagnosis not present

## 2017-08-27 DIAGNOSIS — K640 First degree hemorrhoids: Secondary | ICD-10-CM | POA: Diagnosis not present

## 2017-08-27 DIAGNOSIS — Z87891 Personal history of nicotine dependence: Secondary | ICD-10-CM | POA: Insufficient documentation

## 2017-08-27 DIAGNOSIS — Z882 Allergy status to sulfonamides status: Secondary | ICD-10-CM | POA: Diagnosis not present

## 2017-08-27 DIAGNOSIS — D125 Benign neoplasm of sigmoid colon: Secondary | ICD-10-CM | POA: Diagnosis not present

## 2017-08-27 DIAGNOSIS — K2950 Unspecified chronic gastritis without bleeding: Secondary | ICD-10-CM | POA: Diagnosis not present

## 2017-08-27 HISTORY — DX: Essential (primary) hypertension: I10

## 2017-08-27 HISTORY — PX: ESOPHAGOGASTRODUODENOSCOPY (EGD) WITH PROPOFOL: SHX5813

## 2017-08-27 HISTORY — PX: COLONOSCOPY WITH PROPOFOL: SHX5780

## 2017-08-27 SURGERY — COLONOSCOPY WITH PROPOFOL
Anesthesia: General

## 2017-08-27 MED ORDER — PROPOFOL 10 MG/ML IV BOLUS
INTRAVENOUS | Status: DC | PRN
  Administered 2017-08-27: 50 mg via INTRAVENOUS
  Administered 2017-08-27: 10 mg via INTRAVENOUS
  Administered 2017-08-27: 20 mg via INTRAVENOUS
  Administered 2017-08-27 (×2): 10 mg via INTRAVENOUS

## 2017-08-27 MED ORDER — FENTANYL CITRATE (PF) 100 MCG/2ML IJ SOLN
INTRAMUSCULAR | Status: AC
  Filled 2017-08-27: qty 2

## 2017-08-27 MED ORDER — FENTANYL CITRATE (PF) 100 MCG/2ML IJ SOLN
INTRAMUSCULAR | Status: DC | PRN
  Administered 2017-08-27: 25 ug via INTRAVENOUS

## 2017-08-27 MED ORDER — IPRATROPIUM-ALBUTEROL 0.5-2.5 (3) MG/3ML IN SOLN
RESPIRATORY_TRACT | Status: AC
  Administered 2017-08-27: 3 mL via RESPIRATORY_TRACT
  Filled 2017-08-27: qty 3

## 2017-08-27 MED ORDER — PROPOFOL 500 MG/50ML IV EMUL
INTRAVENOUS | Status: DC | PRN
  Administered 2017-08-27: 180 ug/kg/min via INTRAVENOUS

## 2017-08-27 MED ORDER — LIDOCAINE HCL (PF) 2 % IJ SOLN
INTRAMUSCULAR | Status: AC
  Filled 2017-08-27: qty 10

## 2017-08-27 MED ORDER — IPRATROPIUM-ALBUTEROL 0.5-2.5 (3) MG/3ML IN SOLN
3.0000 mL | Freq: Once | RESPIRATORY_TRACT | Status: AC
  Administered 2017-08-27: 3 mL via RESPIRATORY_TRACT

## 2017-08-27 MED ORDER — PHENYLEPHRINE HCL 10 MG/ML IJ SOLN
INTRAMUSCULAR | Status: DC | PRN
  Administered 2017-08-27: 100 ug via INTRAVENOUS

## 2017-08-27 MED ORDER — SPOT INK MARKER SYRINGE KIT
PACK | SUBMUCOSAL | Status: DC | PRN
  Administered 2017-08-27: 2 mL via SUBMUCOSAL

## 2017-08-27 MED ORDER — ONDANSETRON HCL 4 MG/2ML IJ SOLN
INTRAMUSCULAR | Status: DC | PRN
  Administered 2017-08-27: 4 mg via INTRAVENOUS

## 2017-08-27 MED ORDER — SODIUM CHLORIDE 0.9 % IV SOLN
INTRAVENOUS | Status: DC
  Administered 2017-08-27: 16:00:00 via INTRAVENOUS
  Administered 2017-08-27: 1000 mL via INTRAVENOUS

## 2017-08-27 MED ORDER — MIDAZOLAM HCL 2 MG/2ML IJ SOLN
INTRAMUSCULAR | Status: DC | PRN
  Administered 2017-08-27: 2 mg via INTRAVENOUS

## 2017-08-27 MED ORDER — MIDAZOLAM HCL 2 MG/2ML IJ SOLN
INTRAMUSCULAR | Status: AC
  Filled 2017-08-27: qty 2

## 2017-08-27 MED ORDER — SODIUM CHLORIDE 0.9 % IV SOLN: INTRAVENOUS | Status: DC

## 2017-08-27 MED ORDER — PROPOFOL 10 MG/ML IV BOLUS
INTRAVENOUS | Status: AC
  Filled 2017-08-27: qty 20

## 2017-08-27 NOTE — Transfer of Care (Signed)
Immediate Anesthesia Transfer of Care Note  Patient: Shanena Pellegrino  Procedure(s) Performed: COLONOSCOPY WITH PROPOFOL (N/A ) ESOPHAGOGASTRODUODENOSCOPY (EGD) WITH PROPOFOL (N/A )  Patient Location: PACU and Endoscopy Unit  Anesthesia Type:General  Level of Consciousness: drowsy and patient cooperative  Airway & Oxygen Therapy: Patient Spontanous Breathing and Patient connected to nasal cannula oxygen  Post-op Assessment: Report given to RN and Post -op Vital signs reviewed and stable  Post vital signs: Reviewed and stable  Last Vitals:  Vitals Value Taken Time  BP 120/68 08/27/2017  5:11 PM  Temp 36.2 C 08/27/2017  5:10 PM  Pulse 91 08/27/2017  5:14 PM  Resp 19 08/27/2017  5:14 PM  SpO2 98 % 08/27/2017  5:14 PM  Vitals shown include unvalidated device data.  Last Pain:  Vitals:   08/27/17 1710  TempSrc: Tympanic  PainSc: Asleep      Patients Stated Pain Goal: 0 (77/37/36 6815)  Complications: No apparent anesthesia complications

## 2017-08-27 NOTE — Anesthesia Post-op Follow-up Note (Signed)
Anesthesia QCDR form completed.        

## 2017-08-27 NOTE — Anesthesia Procedure Notes (Signed)
Date/Time: 08/27/2017 4:00 PM Performed by: Allean Found, CRNA Pre-anesthesia Checklist: Patient identified, Emergency Drugs available, Suction available, Patient being monitored and Timeout performed Patient Re-evaluated:Patient Re-evaluated prior to induction Oxygen Delivery Method: Nasal cannula Placement Confirmation: positive ETCO2

## 2017-08-27 NOTE — Op Note (Signed)
[facility] Patient Name: Lindsay Schwartz Procedure Date: 08/27/2017 3:41 PM MRN: [id number] Account #: [id number] Date of Birth: 18-Mar-1956 Admit Type: Outpatient Age: 62 Room: [facility] ENDO ROOM 3 Gender: Female Note Status: Finalized Procedure:            Upper GI endoscopy Indications:          Heme positive stool Providers:            Lollie Sails, MD Referring MD:         Leonie Douglas. Doy Hutching, MD (Referring MD) Medicines:            Monitored Anesthesia Care Complications:        No immediate complications. Procedure:            Pre-Anesthesia Assessment:                       - ASA Grade Assessment: II - A patient with mild                        systemic disease.                       After obtaining informed consent, the endoscope was                        passed under direct vision. Throughout the procedure,                        the patient's blood pressure, pulse, and oxygen                        saturations were monitored continuously. The Endoscope                        was introduced through the mouth, and advanced to the                        third part of duodenum. The patient tolerated the                        procedure well. The upper GI endoscopy was accomplished                        without difficulty. Findings:      LA Grade B (one or more mucosal breaks greater than 5 mm, not extending       between the tops of two mucosal folds) esophagitis with no bleeding was       found. Biopsies were taken with a cold forceps for histology.      The exam of the esophagus was otherwise normal.      Diffuse and patchy minimal inflammation characterized by erythema was       found in the gastric body. Biopsies were taken with a cold forceps for       histology. Biopsies were taken with a cold forceps for Helicobacter       pylori testing.      Patchy mild inflammation characterized by erosions and erythema was       found in the  gastric antrum. Biopsies were taken with a cold forceps for       histology.  The cardia and gastric fundus were normal on retroflexion.      The examined duodenum was normal. Impression:           - LA Grade B erosive esophagitis. Biopsied. Recommendation:       - Perform a colonoscopy today. Procedure Code(s):    --- Professional ---                       [id number], Esophagogastroduodenoscopy, flexible, transoral;                        with biopsy, single or multiple Diagnosis Code(s):    --- Professional ---                       K20.8, Other esophagitis                       R19.5, Other fecal abnormalities CPT copyright 2016 American Medical Association. All rights reserved. The codes documented in this report are preliminary and upon coder review may  be revised to meet current compliance requirements. Lollie Sails, MD 08/27/2017 4:21:05 PM This report has been signed electronically. Number of Addenda: 0 Note Initiated On: 08/27/2017 3:41 PM      [facility]

## 2017-08-27 NOTE — Anesthesia Postprocedure Evaluation (Signed)
Anesthesia Post Note  Patient: Lindsay Schwartz  Procedure(s) Performed: COLONOSCOPY WITH PROPOFOL (N/A ) ESOPHAGOGASTRODUODENOSCOPY (EGD) WITH PROPOFOL (N/A )  Patient location during evaluation: Endoscopy Anesthesia Type: General Level of consciousness: awake and alert Pain management: pain level controlled Vital Signs Assessment: post-procedure vital signs reviewed and stable Respiratory status: spontaneous breathing, nonlabored ventilation, respiratory function stable and patient connected to nasal cannula oxygen Cardiovascular status: blood pressure returned to baseline and stable Postop Assessment: no apparent nausea or vomiting Anesthetic complications: no     Last Vitals:  Vitals:   08/27/17 1720 08/27/17 1730  BP: 124/77 128/75  Pulse: 91 80  Resp: 18 (!) 30  Temp:    SpO2: 98% 98%    Last Pain:  Vitals:   08/27/17 1710  TempSrc: Tympanic  PainSc: Asleep                 Precious Haws Piscitello

## 2017-08-27 NOTE — H&P (Signed)
Outpatient short stay form Pre-procedure 08/27/2017 3:45 PM Lollie Sails MD  Primary Physician: Dr. Fulton Reek, Dr. Nestor Lewandowsky  Reason for visit: EGD and colonoscopy  History of present illness:   patient is a 62 year old female presenting today as above.  He has had some long-term issues with seeing blood in the stool.  She is also being evaluated for a chronic cough.  She is found been found to have an abnormal PET scan and she is Hemoccult positive.  She has no other GI symptoms.  She has never had a colonoscopy or EGD in the past.    Current Facility-Administered Medications:  .  0.9 %  sodium chloride infusion, , Intravenous, Continuous, Lollie Sails, MD, Last Rate: 20 mL/hr at 08/27/17 1446, 1,000 mL at 08/27/17 1446 .  0.9 %  sodium chloride infusion, , Intravenous, Continuous, Lollie Sails, MD  Medications Prior to Admission  Medication Sig Dispense Refill Last Dose  . amitriptyline (ELAVIL) 25 MG tablet Take by mouth.   08/26/2017 at Unknown time  . amLODipine (NORVASC) 10 MG tablet Take 10 mg by mouth daily.   08/26/2017 at Unknown time  . aspirin EC 81 MG tablet Take 1 tablet by mouth daily.   Past Week at Unknown time     Allergies  Allergen Reactions  . Benadryl [Diphenhydramine]   . Codeine   . Phenergan [Promethazine Hcl]   . Sulfur   . Penicillins Rash    Has patient had a PCN reaction causing immediate rash, facial/tongue/throat swelling, SOB or lightheadedness with hypotension: Yes Has patient had a PCN reaction causing severe rash involving mucus membranes or skin necrosis: No Has patient had a PCN reaction that required hospitalization: No Has patient had a PCN reaction occurring within the last 10 years: No If all of the above answers are "NO", then may proceed with Cephalosporin use.     Past Medical History:  Diagnosis Date  . Cysts of both ovaries   . Hypertension     Review of systems:      Physical Exam    Heart and  lungs: Regular rate and rhythm without rub or gallop, lungs are bilaterally clear.    HEENT: Normocephalic atraumatic eyes are anicteric    Other:    Pertinant exam for procedure: Soft nontender nondistended bowel sounds positive normoactive.  There is a fullness in the left abdomen.    Planned proceedures: EGD and colonoscopy with indicated procedures. I have discussed the risks benefits and complications of procedures to include not limited to bleeding, infection, perforation and the risk of sedation and the patient wishes to proceed.    Lollie Sails, MD Gastroenterology 08/27/2017  3:45 PM

## 2017-08-27 NOTE — Anesthesia Preprocedure Evaluation (Signed)
Anesthesia Evaluation  Patient identified by MRN, date of birth, ID band Patient awake    Reviewed: Allergy & Precautions, H&P , NPO status , Patient's Chart, lab work & pertinent test results  History of Anesthesia Complications (+) PONV and history of anesthetic complications  Airway Mallampati: III  TM Distance: <3 FB Neck ROM: full    Dental  (+) Chipped, Poor Dentition   Pulmonary neg shortness of breath, former smoker,           Cardiovascular Exercise Tolerance: Good hypertension, (-) angina(-) Past MI and (-) DOE      Neuro/Psych TIAnegative psych ROS   GI/Hepatic negative GI ROS, Neg liver ROS, neg GERD  ,  Endo/Other  negative endocrine ROS  Renal/GU negative Renal ROS  negative genitourinary   Musculoskeletal   Abdominal   Peds  Hematology negative hematology ROS (+)   Anesthesia Other Findings Past Medical History: No date: Cysts of both ovaries No date: Hypertension  Past Surgical History: No date: carpel tunnel  BMI    Body Mass Index:  31.64 kg/m      Reproductive/Obstetrics negative OB ROS                             Anesthesia Physical Anesthesia Plan  ASA: III  Anesthesia Plan: General   Post-op Pain Management:    Induction: Intravenous  PONV Risk Score and Plan: Propofol infusion and TIVA  Airway Management Planned: Natural Airway and Nasal Cannula  Additional Equipment:   Intra-op Plan:   Post-operative Plan:   Informed Consent: I have reviewed the patients History and Physical, chart, labs and discussed the procedure including the risks, benefits and alternatives for the proposed anesthesia with the patient or authorized representative who has indicated his/her understanding and acceptance.   Dental Advisory Given  Plan Discussed with: Anesthesiologist, CRNA and Surgeon  Anesthesia Plan Comments: (Patient consented for risks of anesthesia  including but not limited to:  - adverse reactions to medications - risk of intubation if required - damage to teeth, lips or other oral mucosa - sore throat or hoarseness - Damage to heart, brain, lungs or loss of life  Patient voiced understanding.)        Anesthesia Quick Evaluation

## 2017-08-30 ENCOUNTER — Ambulatory Visit (INDEPENDENT_AMBULATORY_CARE_PROVIDER_SITE_OTHER): Payer: BLUE CROSS/BLUE SHIELD | Admitting: Cardiothoracic Surgery

## 2017-08-30 ENCOUNTER — Encounter: Payer: Self-pay | Admitting: Gastroenterology

## 2017-08-30 VITALS — BP 121/83 | HR 80 | Temp 97.7°F | Wt 172.0 lb

## 2017-08-30 DIAGNOSIS — K639 Disease of intestine, unspecified: Secondary | ICD-10-CM | POA: Diagnosis not present

## 2017-08-30 DIAGNOSIS — R918 Other nonspecific abnormal finding of lung field: Secondary | ICD-10-CM

## 2017-08-30 DIAGNOSIS — K6389 Other specified diseases of intestine: Secondary | ICD-10-CM

## 2017-08-30 MED ORDER — NEOMYCIN SULFATE 500 MG PO TABS: 500.0000 mg | ORAL_TABLET | Freq: Three times a day (TID) | ORAL | 0 refills | Status: DC

## 2017-08-30 MED ORDER — POLYETHYLENE GLYCOL 3350 17 GM/SCOOP PO POWD: 1.0000 | Freq: Once | ORAL | 0 refills | Status: AC

## 2017-08-30 MED ORDER — ERYTHROMYCIN BASE 500 MG PO TABS: 500.0000 mg | ORAL_TABLET | Freq: Three times a day (TID) | ORAL | 0 refills | Status: DC

## 2017-08-30 MED ORDER — BISACODYL EC 5 MG PO TBEC: DELAYED_RELEASE_TABLET | ORAL | 0 refills | Status: DC

## 2017-08-30 NOTE — H&P (View-Only) (Signed)
Patient ID: Lindsay Schwartz, female   DOB: 08/03/1955, 62 y.o.   MRN: [id number]  HPI Lindsay Schwartz is a 62 y.o. female seen at the request of Dr. Genevive Bi ( case d/w him in detail).  Who was being worked up for multiple pulmonary nodules.  Recently in the emergency room chest x-ray revealed evidence of nodules on the right side and on the left side.  She has been worked up and also a PET/CT has been performed and a half personal review showing evidence of mild intake within the pulmonary nodules are more significantly significant increase intake within the sigmoid colon.  This prompted a colonoscopy last week showing evidence of a near obstructing 5 cm polyp within the sigmoid colon.  She also had some small polyps that were resected at that time.  Pathology still pending. She is able to perform more than 4 mets W/O SOB OR C/P. No weight loss, + hemoccult,   HPI  Past Medical History:  Diagnosis Date  . Cysts of both ovaries   . Hypertension     Past Surgical History:  Procedure Laterality Date  . carpel tunnel    . COLONOSCOPY WITH PROPOFOL N/A 08/27/2017   Procedure: COLONOSCOPY WITH PROPOFOL;  Surgeon: Lollie Sails, MD;  Location: [facility] ENDOSCOPY;  Service: Endoscopy;  Laterality: N/A;  . ESOPHAGOGASTRODUODENOSCOPY (EGD) WITH PROPOFOL N/A 08/27/2017   Procedure: ESOPHAGOGASTRODUODENOSCOPY (EGD) WITH PROPOFOL;  Surgeon: Lollie Sails, MD;  Location: [facility] ENDOSCOPY;  Service: Endoscopy;  Laterality: N/A;    Family History  Problem Relation Age of Onset  . CAD Father   . Breast cancer Maternal Aunt 21    Social History Social History   Tobacco Use  . Smoking status: Former Research scientist (life sciences)  . Smokeless tobacco: Never Used  Substance Use Topics  . Alcohol use: No    Alcohol/week: 0.0 oz  . Drug use: No    Allergies  Allergen Reactions  . Benadryl [Diphenhydramine]   . Codeine   . Phenergan [Promethazine Hcl]   . Sulfur   . Penicillins Rash    Has patient had a PCN  reaction causing immediate rash, facial/tongue/throat swelling, SOB or lightheadedness with hypotension: Yes Has patient had a PCN reaction causing severe rash involving mucus membranes or skin necrosis: No Has patient had a PCN reaction that required hospitalization: No Has patient had a PCN reaction occurring within the last 10 years: No If all of the above answers are "NO", then may proceed with Cephalosporin use.    Current Outpatient Medications  Medication Sig Dispense Refill  . amitriptyline (ELAVIL) 25 MG tablet Take by mouth.    Marland Kitchen amLODipine (NORVASC) 10 MG tablet Take 10 mg by mouth daily.    Marland Kitchen aspirin EC 81 MG tablet Take 1 tablet by mouth daily.     No current facility-administered medications for this visit.      Review of Systems Full ROS  was asked and was negative except for the information on the HPI  Physical Exam Blood pressure 121/83, pulse 80, temperature 97.7 F (36.5 C), temperature source Oral, weight 78 kg (172 lb), SpO2 96 %. CONSTITUTIONAL: NAD EYES: Pupils are equal, round, and reactive to light, Sclera are non-icteric. EARS, NOSE, MOUTH AND THROAT: The oropharynx is clear. The oral mucosa is pink and moist. Hearing is intact to voice. LYMPH NODES:  Lymph nodes in the neck are normal. RESPIRATORY:  Lungs are clear. There is normal respiratory effort, with equal breath sounds bilaterally, and without  pathologic use of accessory muscles. CARDIOVASCULAR: Heart is regular without murmurs, gallops, or rubs. GI: The abdomen is  soft, nontender, and nondistended. There are no palpable masses. There is no hepatosplenomegaly. Reducible Umbilical hernia GU: Rectal deferred.   MUSCULOSKELETAL: Normal muscle strength and tone. No cyanosis or edema.   SKIN: Turgor is good and there are no pathologic skin lesions or ulcers. NEUROLOGIC: Motor and sensation is grossly normal. Cranial nerves are grossly intact. PSYCH:  Oriented to person, place and time. Affect is  normal.  Data Reviewed  I have personally reviewed the patient's imaging, laboratory findings and medical records.    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  I do recommend a sigmoid colectomy given her symptoms, characteristics of the polyp and size.  She does have a pending biopsy of the long in a few days.  Service time for the operation I think we can schedule her within the next 2 weeks.  I have discussed with the patient detail about sigmoid colectomy.  Open versus laparoscopically.  Risk, benefits and possible complications including but not limited to: Bleeding, leak, re interventions, chronic pain, infection.  She understands and wishes to proceed. Plan for laparoscopic sigmoid colectomy possible open. Extensive counseling provided.  Time spent with the patient was 60 minutes, with more than 50% of the time spent in face-to-face education, counseling and care coordination.     Caroleen Hamman, MD FACS General Surgeon 08/30/2017, 1:11 PM

## 2017-08-30 NOTE — Progress Notes (Signed)
Patient ID: Lindsay Schwartz, female   DOB: [birth year], 62 y.o.   MRN: [id number]  HPI Lindsay Schwartz is a 62 y.o. female seen at the request of Dr. Genevive Bi ( case d/w him in detail).  Who was being worked up for multiple pulmonary nodules.  Recently in the emergency room chest x-ray revealed evidence of nodules on the right side and on the left side.  She has been worked up and also a PET/CT has been performed and a half personal review showing evidence of mild intake within the pulmonary nodules are more significantly significant increase intake within the sigmoid colon.  This prompted a colonoscopy last week showing evidence of a near obstructing 5 cm polyp within the sigmoid colon.  She also had some small polyps that were resected at that time.  Pathology still pending. She is able to perform more than 4 mets W/O SOB OR C/P. No weight loss, + hemoccult,   HPI  Past Medical History:  Diagnosis Date  . Cysts of both ovaries   . Hypertension     Past Surgical History:  Procedure Laterality Date  . carpel tunnel    . COLONOSCOPY WITH PROPOFOL N/A 08/27/2017   Procedure: COLONOSCOPY WITH PROPOFOL;  Surgeon: Lollie Sails, MD;  Location: [facility] ENDOSCOPY;  Service: Endoscopy;  Laterality: N/A;  . ESOPHAGOGASTRODUODENOSCOPY (EGD) WITH PROPOFOL N/A 08/27/2017   Procedure: ESOPHAGOGASTRODUODENOSCOPY (EGD) WITH PROPOFOL;  Surgeon: Lollie Sails, MD;  Location: [facility] ENDOSCOPY;  Service: Endoscopy;  Laterality: N/A;    Family History  Problem Relation Age of Onset  . CAD Father   . Breast cancer Maternal Aunt 38    Social History Social History   Tobacco Use  . Smoking status: Former Research scientist (life sciences)  . Smokeless tobacco: Never Used  Substance Use Topics  . Alcohol use: No    Alcohol/week: 0.0 oz  . Drug use: No    Allergies  Allergen Reactions  . Benadryl [Diphenhydramine]   . Codeine   . Phenergan [Promethazine Hcl]   . Sulfur   . Penicillins Rash    Has patient had a PCN  reaction causing immediate rash, facial/tongue/throat swelling, SOB or lightheadedness with hypotension: Yes Has patient had a PCN reaction causing severe rash involving mucus membranes or skin necrosis: No Has patient had a PCN reaction that required hospitalization: No Has patient had a PCN reaction occurring within the last 10 years: No If all of the above answers are "NO", then may proceed with Cephalosporin use.    Current Outpatient Medications  Medication Sig Dispense Refill  . amitriptyline (ELAVIL) 25 MG tablet Take by mouth.    Marland Kitchen amLODipine (NORVASC) 10 MG tablet Take 10 mg by mouth daily.    Marland Kitchen aspirin EC 81 MG tablet Take 1 tablet by mouth daily.     No current facility-administered medications for this visit.      Review of Systems Full ROS  was asked and was negative except for the information on the HPI  Physical Exam Blood pressure 121/83, pulse 80, temperature 97.7 F (36.5 C), temperature source Oral, weight 78 kg (172 lb), SpO2 96 %. CONSTITUTIONAL: NAD EYES: Pupils are equal, round, and reactive to light, Sclera are non-icteric. EARS, NOSE, MOUTH AND THROAT: The oropharynx is clear. The oral mucosa is pink and moist. Hearing is intact to voice. LYMPH NODES:  Lymph nodes in the neck are normal. RESPIRATORY:  Lungs are clear. There is normal respiratory effort, with equal breath sounds bilaterally, and without  pathologic use of accessory muscles. CARDIOVASCULAR: Heart is regular without murmurs, gallops, or rubs. GI: The abdomen is  soft, nontender, and nondistended. There are no palpable masses. There is no hepatosplenomegaly. Reducible Umbilical hernia GU: Rectal deferred.   MUSCULOSKELETAL: Normal muscle strength and tone. No cyanosis or edema.   SKIN: Turgor is good and there are no pathologic skin lesions or ulcers. NEUROLOGIC: Motor and sensation is grossly normal. Cranial nerves are grossly intact. PSYCH:  Oriented to person, place and time. Affect is  normal.  Data Reviewed  I have personally reviewed the patient's imaging, laboratory findings and medical records.    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  I do recommend a sigmoid colectomy given her symptoms, characteristics of the polyp and size.  She does have a pending biopsy of the long in a few days.  Service time for the operation I think we can schedule her within the next 2 weeks.  I have discussed with the patient detail about sigmoid colectomy.  Open versus laparoscopically.  Risk, benefits and possible complications including but not limited to: Bleeding, leak, re interventions, chronic pain, infection.  She understands and wishes to proceed. Plan for laparoscopic sigmoid colectomy possible open. Extensive counseling provided.  Time spent with the patient was 60 minutes, with more than 50% of the time spent in face-to-face education, counseling and care coordination.     Caroleen Hamman, MD FACS General Surgeon 08/30/2017, 1:11 PM

## 2017-08-30 NOTE — Op Note (Signed)
[facility] Gastroenterology Patient Name: Lindsay Schwartz Procedure Date: 08/27/2017 3:40 PM MRN: [id number] Account #: [id number] Date of Birth: 01/29/1956 Admit Type: Outpatient Age: 62 Room: [facility] ENDO ROOM 3 Gender: Female Note Status: Finalized Procedure:            Colonoscopy Indications:          Abnormal PET scan of the GI tract Providers:            Lollie Sails, MD Referring MD:         Leonie Douglas. Doy Hutching, MD (Referring MD) Medicines:            Monitored Anesthesia Care Complications:        No immediate complications. Procedure:            Pre-Anesthesia Assessment:                       - ASA Grade Assessment: II - A patient with mild                        systemic disease.                       After obtaining informed consent, the colonoscope was                        passed under direct vision. Throughout the procedure,                        the patient's blood pressure, pulse, and oxygen                        saturations were monitored continuously. The                        Colonoscope was introduced through the anus and                        advanced to the the cecum, identified by appendiceal                        orifice and ileocecal valve. The patient tolerated the                        procedure well. The quality of the bowel preparation                        was good. Findings:      Multiple small-mouthed diverticula were found in the sigmoid colon and       descending colon.      A greater than 50 mm polyp was found at 23 cm proximal to the anus. The       polyp was pedunculated, on a very broad stalk. Multiple biopsies were       obtained and the wall of the colon was tatooed at the side of the       lesion. There was enough room to the side of the lesion to allow passage       of the colonoscope, which was then carefully advanced to the cecum.      A 7 mm polyp was found in the proximal sigmoid colon. The polyp was  semi-pedunculated. The polyp was removed with a lift and cut technique       using a hot snare. The polyp was removed with a cold snare. Resection       and retrieval were complete.      A 4 mm polyp was found in the splenic flexure. The polyp was sessile.       The polyp was removed with a cold biopsy forceps. Resection and       retrieval were complete.      The retroflexed view of the distal rectum and anal verge was normal and       showed no anal or rectal abnormalities.      Non-bleeding external and internal hemorrhoids were found during digital       exam and during anoscopy. The hemorrhoids were small and Grade I       (internal hemorrhoids that do not prolapse). Impression:           - Diverticulosis in the sigmoid colon and in the                        descending colon.                       - One greater than 50 mm polyp at 23 cm proximal to the                        anus.                       - One 7 mm polyp in the proximal sigmoid colon, removed                        with a cold snare and removed using lift and cut and a                        hot snare. Resected and retrieved.                       - One 4 mm polyp at the splenic flexure, removed with a                        cold biopsy forceps. Resected and retrieved.                       - The distal rectum and anal verge are normal on                        retroflexion view.                       - Non-bleeding external and internal hemorrhoids. Recommendation:       - Low residue diet.                       - Await pathology results.                       - further recommendations re large sigmoid polyp                        following results. Will likely need  surgical resection. Procedure Code(s):    --- Professional ---                       [phone], Colonoscopy, flexible; with removal of tumor(s),                        polyp(s), or other lesion(s) by snare technique                       45380, 32,  Colonoscopy, flexible; with biopsy, single                        or multiple Diagnosis Code(s):    --- Professional ---                       K64.0, First degree hemorrhoids                       D12.6, Benign neoplasm of colon, unspecified                       D12.5, Benign neoplasm of sigmoid colon                       D12.3, Benign neoplasm of transverse colon (hepatic                        flexure or splenic flexure)                       K57.30, Diverticulosis of large intestine without                        perforation or abscess without bleeding                       R93.3, Abnormal findings on diagnostic imaging of other                        parts of digestive tract CPT copyright 2016 American Medical Association. All rights reserved. The codes documented in this report are preliminary and upon coder review may  be revised to meet current compliance requirements. Lollie Sails, MD 08/30/2017 9:37:29 AM This report has been signed electronically. Number of Addenda: 0 Note Initiated On: 08/27/2017 3:40 PM Scope Withdrawal Time: 0 hours 22 minutes 47 seconds  Total Procedure Duration: 0 hours 40 minutes 17 seconds       [facility]

## 2017-08-30 NOTE — Patient Instructions (Signed)
Please look at your blue sheet in case you have any questions about your surgery.  Your prescriptions will be sent to your pharmacy.

## 2017-08-30 NOTE — Addendum Note (Signed)
Addended by: Wayna Chalet on: 08/30/2017 03:46 PM   Modules accepted: Orders

## 2017-08-30 NOTE — Progress Notes (Signed)
  Patient ID: Lindsay Schwartz, female   DOB: [birth year], 62 y.o.   MRN: [id number]  HISTORY: She returns today in follow-up.  She did undergo a colonoscopy on Friday.  She states that since that time she has had fairly normal bowel movements without any abdominal pain.  The colonoscopy did show a large 5 cm polyp in the rectosigmoid area.  Biopsies were obtained.  I asked her to come in today to discuss the management of her lung nodules as well as her colon mass.   Vitals:   08/30/17 0811  BP: 121/83  Pulse: 80  Temp: 97.7 F (36.5 C)  SpO2: 96%     EXAM:    Resp: Lungs are clear bilaterally.  No respiratory distress, normal effort. Heart:  Regular without murmurs Abd:  Abdomen is soft, non distended and non tender. No masses are palpable.  There is no rebound and no guarding.  Neurological: Alert and oriented to person, place, and time. Coordination normal.  Skin: Skin is warm and dry. No rash noted. No diaphoretic. No erythema. No pallor.  Psychiatric: Normal mood and affect. Normal behavior. Judgment and thought content normal.    ASSESSMENT: Large 5 cm colonic polyp as well as multiple bilateral lung nodules.  She is scheduled to undergo a CT-guided needle biopsy of the 2 largest lung lesions later this week.  She will be seen today by Dr. Marlis Edelson regarding management of her colon mass.   PLAN:   We will proceed with biopsy of the lung lesions in case this is metastatic colon cancer.  She will also see our general surgeons to discuss removal of her colon.  I will see her back when the biopsies are available.    Lindsay Lewandowsky, MD

## 2017-09-01 ENCOUNTER — Other Ambulatory Visit: Payer: Self-pay | Admitting: Radiology

## 2017-09-01 LAB — SURGICAL PATHOLOGY

## 2017-09-02 ENCOUNTER — Ambulatory Visit
Admission: RE | Admit: 2017-09-02 | Discharge: 2017-09-02 | Disposition: A | Discharge status: Home / Self Care | Payer: BLUE CROSS/BLUE SHIELD | Source: Ambulatory Visit | Attending: Cardiothoracic Surgery | Admitting: Cardiothoracic Surgery

## 2017-09-02 MED ORDER — SODIUM CHLORIDE 0.9 % IV SOLN: INTRAVENOUS | Status: DC

## 2017-09-02 NOTE — Telephone Encounter (Signed)
See other phone note

## 2017-09-02 NOTE — Telephone Encounter (Signed)
Called patient to let her know that the Radiologist called Dr. Genevive Bi to let him know that he is cancelling the CT Lung Biopsy that is scheduled for today. I told patient that I would call her once Dr. Genevive Bi tells me what would be her next step. Patient agreed.

## 2017-09-03 ENCOUNTER — Telehealth: Payer: Self-pay | Admitting: Surgery

## 2017-09-03 NOTE — Telephone Encounter (Signed)
I have called patient to discuss information below. No answer. I have left a message on the voicemail.    pre op date/time and sx date. Sx: 09/16/17 with Dr Pabon--laparoscopic sigmoid colectomy.  Pre op: 09/09/17 @12 :45pm--office interview.

## 2017-09-06 ENCOUNTER — Telehealth: Payer: Self-pay

## 2017-09-06 NOTE — Telephone Encounter (Signed)
An order to [facility] for a CT Lung Guided Biopsy was faxed to [phone]. Awaiting for Gizela to call me back with an appointment date and time. She calls the patient to schedule.  

## 2017-09-09 ENCOUNTER — Other Ambulatory Visit: Payer: Self-pay

## 2017-09-09 ENCOUNTER — Encounter
Admission: RE | Admit: 2017-09-09 | Discharge: 2017-09-09 | Disposition: A | Discharge status: Home / Self Care | Payer: BLUE CROSS/BLUE SHIELD | Source: Ambulatory Visit | Attending: Surgery | Admitting: Surgery

## 2017-09-09 DIAGNOSIS — Z01812 Encounter for preprocedural laboratory examination: Secondary | ICD-10-CM | POA: Insufficient documentation

## 2017-09-09 HISTORY — DX: Adverse effect of unspecified anesthetic, initial encounter: T41.45XA

## 2017-09-09 HISTORY — DX: Other complications of anesthesia, initial encounter: T88.59XA

## 2017-09-09 HISTORY — DX: Other specified diseases of intestine: K63.89

## 2017-09-09 HISTORY — DX: Paresthesia of skin: R20.2

## 2017-09-09 HISTORY — DX: Cerebral infarction, unspecified: I63.9

## 2017-09-09 HISTORY — DX: Transient cerebral ischemic attack, unspecified: G45.9

## 2017-09-09 LAB — CBC WITH DIFFERENTIAL/PLATELET
BASOS ABS: 0.1 10*3/uL (ref 0–0.1)
BASOS PCT: 1 %
Eosinophils Absolute: 0.1 10*3/uL (ref 0–0.7)
Eosinophils Relative: 1 %
HEMATOCRIT: 43.4 % (ref 35.0–47.0)
HEMOGLOBIN: 14.6 g/dL (ref 12.0–16.0)
LYMPHS PCT: 22 %
Lymphs Abs: 1.4 10*3/uL (ref 1.0–3.6)
MCH: 28.2 pg (ref 26.0–34.0)
MCHC: 33.8 g/dL (ref 32.0–36.0)
MCV: 83.4 fL (ref 80.0–100.0)
MONOS PCT: 7 %
Monocytes Absolute: 0.4 10*3/uL (ref 0.2–0.9)
NEUTROS ABS: 4.4 10*3/uL (ref 1.4–6.5)
NEUTROS PCT: 69 %
Platelets: 251 10*3/uL (ref 150–440)
RBC: 5.2 MIL/uL (ref 3.80–5.20)
RDW: 14.5 % (ref 11.5–14.5)
WBC: 6.4 10*3/uL (ref 3.6–11.0)

## 2017-09-09 LAB — COMPREHENSIVE METABOLIC PANEL
ALBUMIN: 4.8 g/dL (ref 3.5–5.0)
ALK PHOS: 85 U/L (ref 38–126)
ALT: 28 U/L (ref 14–54)
AST: 28 U/L (ref 15–41)
Anion gap: 9 (ref 5–15)
BILIRUBIN TOTAL: 1.5 mg/dL — AB (ref 0.3–1.2)
BUN: 12 mg/dL (ref 6–20)
CALCIUM: 9 mg/dL (ref 8.9–10.3)
CO2: 26 mmol/L (ref 22–32)
Chloride: 104 mmol/L (ref 101–111)
Creatinine, Ser: 0.69 mg/dL (ref 0.44–1.00)
GFR calc Af Amer: >60 mL/min (ref >60)
GFR calc non Af Amer: >60 mL/min (ref >60)
GLUCOSE: 97 mg/dL (ref 65–99)
Potassium: 3.3 mmol/L — ABNORMAL LOW (ref 3.5–5.1)
Sodium: 139 mmol/L (ref 135–145)
TOTAL PROTEIN: 8.3 g/dL — AB (ref 6.5–8.1)

## 2017-09-09 LAB — TYPE AND SCREEN
ABO/RH(D): A POS
Antibody Screen: NEGATIVE

## 2017-09-09 LAB — PROTIME-INR
INR: 0.96
Prothrombin Time: 12.7 seconds (ref 11.4–15.2)

## 2017-09-09 NOTE — Patient Instructions (Signed)
Your procedure is scheduled on: Thursday, September 16, 2017  Report to [facility]  To find out your arrival time please call [phone] between          1PM - 3PM on Wednesday, April 10  Remember: Instructions that are not followed completely may result in serious  medical risk, up to and including death, or upon the discretion of your surgeon  and anesthesiologist your surgery may need to be rescheduled.     _X__ 1. Do not eat food after midnight the night before your procedure.                 No gum chewing or hard candies.                   You may drink clear liquids up to 2 hours                 before you are scheduled to arrive for your surgery-                  DO not drink clear liquids within 2 hours of the start of your surgery.                  Clear Liquids include:  water, apple juice without pulp, clear carbohydrate                 drink such as Clearfast of Gartorade, Black Coffee or Tea (Do not add                 anything to coffee or tea).  __X__2.  On the morning of surgery brush your teeth with toothpaste and water,                           you may rinse your mouth with mouthwash if you wish.                                  Do not swallow any toothpaste of mouthwash.     _X__ 3.  No Alcohol for 24 hours before or after surgery.   _X__ 4.  Do Not Smoke or use e-cigarettes For 24 Hours Prior to Your Surgery.                 Do not use any chewable tobacco products for at least 6 hours prior to                 surgery.  ____  5.  Bring all medications with you on the day of surgery if instructed.   __X__  6.  Notify your doctor if there is any change in your medical condition      (cold, fever, infections).     Do not wear jewelry, make-up, hairpins, clips or nail polish. Do not wear lotions, powders, or perfumes. You may wear deodorant. Do not shave 48 hours prior to surgery. Men may shave face and  neck. Do not bring valuables to the hospital.      [facility] is not responsible for any belongings or valuables.  Contacts, dentures or bridgework may not be worn into surgery. Leave your suitcase in the car. After surgery it may be brought to your room. For patients admitted to the hospital, discharge time is determined by your treatment  team.   Patients discharged the day of surgery will not be allowed to drive home.   Please read over the following fact sheets that you were given:   PREPARING FOR SURGERY   ____ Take these medicines the morning of surgery with A SIP OF WATER:    1. AMLODIPINE  2.   3.   4.  5.  6.  ____ Fleet Enema (as directed)   __X__ Use CHG Soap as directed  ____ Use inhalers on the day of surgery  ____ Stop aspirin NOW!!            THIS INCLUDES EXCEDRIN / BC POWDERS / GOODIES POWDERS  ____ Stop Anti-inflammatories NOW!!          THIS INCLUDES IBUPROFEN / MOTRIN / ADVIL / ALEVE / NAPROXEN   ____ Stop supplements until after surgery.    ____ Bring C-Pap to the hospital.   [place].

## 2017-09-09 NOTE — Telephone Encounter (Signed)
Patient is calling asking if you have heard anything yet from [facility]. Please call patient and advise.

## 2017-09-09 NOTE — Pre-Procedure Instructions (Signed)
Patient asking a lot of questions regarding the surgery, recovery, food intake, activity post op,etc. She was also concerned about the process for the CT of lung and when that will be completed.  She feels a little lost without any distinct information of what she has and what might be her future medical treatment plans. She said that she would go upstairs to dr. Corlis Leak office to see if they have new info about her upcoming procedure.

## 2017-09-09 NOTE — Telephone Encounter (Signed)
Patient came to office today after pre-admission appointment and was upset due to information of surgery risks that were discussed.   Spoke with patient in office with Dr.Oaks present and patient wishes to cancel the CT lung biopsy 09/14/17 @ 7:45 am. Patient feels it is to much for her due to having a  Laparoscopic Sigmoid colectomy 09/16/17. Per Dr.Oaks we can discuss CT biopsy after Colon surgery. Copy of  CT scan and Pet scan results were provided to patient.   Spoke with Hoyle Sauer at [facility] and CT biopsy was cancelled for 09/14/17 @ 7:45 am per patient request.

## 2017-09-09 NOTE — Telephone Encounter (Signed)
Spoke with Lindsay Schwartz and CT Lung guided biopsy is scheduled 09/14/17 @ 7:30. Stop Aspirin. Will need a driver. Nothing by mouth after midnight.  Patient notified. [facility], [place] [address] 1st floor [employer]  Red/[employer]

## 2017-09-10 ENCOUNTER — Telehealth: Payer: Self-pay

## 2017-09-10 ENCOUNTER — Ambulatory Visit: Payer: Self-pay | Admitting: Cardiothoracic Surgery

## 2017-09-10 LAB — CEA: CEA1: 0.9 ng/mL (ref 0.0–4.7)

## 2017-09-10 MED ORDER — POTASSIUM CHLORIDE CRYS ER 20 MEQ PO TBCR: 20.0000 meq | EXTENDED_RELEASE_TABLET | Freq: Two times a day (BID) | ORAL | 0 refills | Status: DC

## 2017-09-10 NOTE — Telephone Encounter (Signed)
Called patient to let her know that her potassium was low and that she needed to start taking potassium for 5 days before her surgery. I told her that she needed to take Potassium chloride 20 MEq 1 tablet BID for 5 days and this was sent to her pharmacy. Patient understood and had no further questions.

## 2017-09-13 NOTE — Pre-Procedure Instructions (Signed)
EKG  ED ECG REPORT I, Arta Silence, the attending physician, personally viewed and interpreted this ECG.  Date: 05/24/2017 EKG Time: 1435 Rate: 83 Rhythm: normal sinus rhythm QRS Axis: normal Intervals: normal ST/T Wave abnormalities: Nonspecific T wave flattening Narrative Interpretation: no evidence of acute ischemia; no significant change when compared to EKG of 10/29/2015  ____________________________________________  RADIOLOGY  CXR: no focal infiltrate or pulmonary edema  ____________________________________________   PROCEDURES  Procedure(s) performed: No    Critical Care performed: No ____________________________________________   INITIAL IMPRESSION / ASSESSMENT AND PLAN / ED COURSE  Pertinent labs & imaging results that were available during my care of the patient were reviewed by me and considered in my medical decision making (see chart for details).  62 year old female with past medical history as noted above presents with hypertension as well as chest and back discomfort since earlier today.  Onset of the hypertension itself is unknown, but she says it was previously high when measured at her doctor 1 year ago.  Patient is not currently on an antihypertensive.  Past medical records reviewed in Epic and are noncontributory.  On exam, vital signs are normal except for initial hypertension though at the time of my exam her blood pressure was 160s over 90s.  The remainder the exam is unremarkable.  Hesitation is consistent with essential hypertension, and I have low suspicion for ACS given the minimal and nonspecific chest and back discomfort.  There is no evidence of aortic dissection or other vascular cause.  Plan for cardiac enzymes x2, basic labs, and given that patient has had likely long-standing untreated hypertension will discharge with antihypertensive and plan to follow-up with primary  care.  ----------------------------------------- 6:41 PM on 05/24/2017 -----------------------------------------  Patient's repeat troponin is negative.  She is very upset because she did not have anyone come over when she needs to get unhooked from her monitor to urinate.  Blood pressure has remained stable.  She states that she would like to leave now.  I explained the results of the test to the patient, verify that she had a primary care doctor to follow-up with, and explained the prescription.  I also gave the patient return precautions, and she expressed understanding.  She is safe for discharge home at this time.  ____________________________________________   FINAL CLINICAL IMPRESSION(S) / ED DIAGNOSES  Final diagnoses:  Hypertension, unspecified type  Atypical chest pain      NEW MEDICATIONS STARTED DURING THIS VISIT:  This SmartLink is deprecated. Use AVSMEDLIST instead to display the medication list for a patient.   Note:  This document was prepared using Dragon voice recognition software and may include unintentional dictation errors.    Arta Silence, MD 05/24/17 1842           Electronically signed by Arta Silence, MD at 05/24/2017 6:42 PM     ED on 05/24/2017        Detailed Report

## 2017-09-13 NOTE — Telephone Encounter (Signed)
Patient cancelled her CT Guided Lung Biopsy. See other note.

## 2017-09-15 MED ORDER — ERTAPENEM SODIUM 1 G IJ SOLR
1.0000 g | INTRAMUSCULAR | Status: AC
  Administered 2017-09-16: 1 g via INTRAVENOUS
  Filled 2017-09-15: qty 1

## 2017-09-16 ENCOUNTER — Inpatient Hospital Stay: Payer: BLUE CROSS/BLUE SHIELD | Admitting: Anesthesiology

## 2017-09-16 ENCOUNTER — Encounter
Admission: RE | Disposition: A | Discharge status: Home / Self Care | Payer: Self-pay | Source: Ambulatory Visit | Attending: Surgery

## 2017-09-16 ENCOUNTER — Other Ambulatory Visit: Payer: Self-pay

## 2017-09-16 ENCOUNTER — Inpatient Hospital Stay
Admission: RE | Admit: 2017-09-16 | Discharge: 2017-09-19 | DRG: 331 | Disposition: A | Discharge status: Home / Self Care | Payer: BLUE CROSS/BLUE SHIELD | Source: Ambulatory Visit | Attending: Surgery | Admitting: Surgery

## 2017-09-16 DIAGNOSIS — K429 Umbilical hernia without obstruction or gangrene: Secondary | ICD-10-CM | POA: Diagnosis present

## 2017-09-16 DIAGNOSIS — Z79899 Other long term (current) drug therapy: Secondary | ICD-10-CM

## 2017-09-16 DIAGNOSIS — R918 Other nonspecific abnormal finding of lung field: Secondary | ICD-10-CM | POA: Diagnosis present

## 2017-09-16 DIAGNOSIS — Z7982 Long term (current) use of aspirin: Secondary | ICD-10-CM

## 2017-09-16 DIAGNOSIS — Z9049 Acquired absence of other specified parts of digestive tract: Secondary | ICD-10-CM

## 2017-09-16 DIAGNOSIS — K635 Polyp of colon: Secondary | ICD-10-CM

## 2017-09-16 DIAGNOSIS — Z87891 Personal history of nicotine dependence: Secondary | ICD-10-CM

## 2017-09-16 DIAGNOSIS — I10 Essential (primary) hypertension: Secondary | ICD-10-CM | POA: Diagnosis present

## 2017-09-16 DIAGNOSIS — D125 Benign neoplasm of sigmoid colon: Principal | ICD-10-CM | POA: Diagnosis present

## 2017-09-16 HISTORY — PX: LAPAROSCOPIC SIGMOID COLECTOMY: SHX5928

## 2017-09-16 LAB — CREATININE, SERUM: CREATININE: 0.66 mg/dL (ref 0.44–1.00)

## 2017-09-16 LAB — CBC
HCT: 40.1 % (ref 35.0–47.0)
Hemoglobin: 13.9 g/dL (ref 12.0–16.0)
MCH: 28.3 pg (ref 26.0–34.0)
MCHC: 34.6 g/dL (ref 32.0–36.0)
MCV: 81.8 fL (ref 80.0–100.0)
PLATELETS: 243 10*3/uL (ref 150–440)
RBC: 4.89 MIL/uL (ref 3.80–5.20)
RDW: 14.2 % (ref 11.5–14.5)
WBC: 15.3 10*3/uL — AB (ref 3.6–11.0)

## 2017-09-16 LAB — POCT I-STAT 4, (NA,K, GLUC, HGB,HCT)
Glucose, Bld: 109 mg/dL — ABNORMAL HIGH (ref 65–99)
HCT: 43 % (ref 36.0–46.0)
Hemoglobin: 14.6 g/dL (ref 12.0–15.0)
POTASSIUM: 3.6 mmol/L (ref 3.5–5.1)
SODIUM: 140 mmol/L (ref 135–145)

## 2017-09-16 LAB — ABO/RH: ABO/RH(D): A POS

## 2017-09-16 SURGERY — COLECTOMY, SIGMOID, LAPAROSCOPIC
Anesthesia: General

## 2017-09-16 MED ORDER — METHOCARBAMOL 500 MG PO TABS
500.0000 mg | ORAL_TABLET | Freq: Four times a day (QID) | ORAL | Status: DC | PRN
  Filled 2017-09-16: qty 1

## 2017-09-16 MED ORDER — LACTATED RINGERS IV SOLN
INTRAVENOUS | Status: DC
  Administered 2017-09-16: 10:00:00 via INTRAVENOUS

## 2017-09-16 MED ORDER — MIDAZOLAM HCL 2 MG/2ML IJ SOLN
INTRAMUSCULAR | Status: AC
  Filled 2017-09-16: qty 2

## 2017-09-16 MED ORDER — KETOROLAC TROMETHAMINE 15 MG/ML IJ SOLN
15.0000 mg | Freq: Four times a day (QID) | INTRAMUSCULAR | Status: DC
  Administered 2017-09-16 – 2017-09-18 (×7): 15 mg via INTRAVENOUS
  Filled 2017-09-16 (×8): qty 1

## 2017-09-16 MED ORDER — PROPOFOL 10 MG/ML IV BOLUS
INTRAVENOUS | Status: AC
  Filled 2017-09-16: qty 20

## 2017-09-16 MED ORDER — ACETAMINOPHEN 500 MG PO TABS
ORAL_TABLET | ORAL | Status: AC
  Administered 2017-09-16: 1000 mg via ORAL
  Filled 2017-09-16: qty 2

## 2017-09-16 MED ORDER — FENTANYL CITRATE (PF) 100 MCG/2ML IJ SOLN
INTRAMUSCULAR | Status: AC
  Filled 2017-09-16: qty 2

## 2017-09-16 MED ORDER — SODIUM CHLORIDE 0.9 % IJ SOLN
INTRAMUSCULAR | Status: AC
  Filled 2017-09-16: qty 50

## 2017-09-16 MED ORDER — MIDAZOLAM HCL 2 MG/2ML IJ SOLN
INTRAMUSCULAR | Status: DC | PRN
  Administered 2017-09-16: 2 mg via INTRAVENOUS

## 2017-09-16 MED ORDER — ROCURONIUM BROMIDE 100 MG/10ML IV SOLN
INTRAVENOUS | Status: DC | PRN
  Administered 2017-09-16 (×2): 10 mg via INTRAVENOUS
  Administered 2017-09-16: 40 mg via INTRAVENOUS
  Administered 2017-09-16: 10 mg via INTRAVENOUS

## 2017-09-16 MED ORDER — CELECOXIB 200 MG PO CAPS
200.0000 mg | ORAL_CAPSULE | ORAL | Status: AC
  Administered 2017-09-16: 200 mg via ORAL

## 2017-09-16 MED ORDER — HYDROMORPHONE HCL 1 MG/ML IJ SOLN
0.5000 mg | INTRAMUSCULAR | Status: DC | PRN
  Administered 2017-09-16: 0.5 mg via INTRAVENOUS
  Filled 2017-09-16: qty 0.5

## 2017-09-16 MED ORDER — FENTANYL CITRATE (PF) 250 MCG/5ML IJ SOLN
INTRAMUSCULAR | Status: AC
  Filled 2017-09-16: qty 5

## 2017-09-16 MED ORDER — CELECOXIB 200 MG PO CAPS
ORAL_CAPSULE | ORAL | Status: AC
  Administered 2017-09-16: 200 mg via ORAL
  Filled 2017-09-16: qty 1

## 2017-09-16 MED ORDER — LACTATED RINGERS IV SOLN
INTRAVENOUS | Status: DC | PRN
  Administered 2017-09-16: 09:00:00 via INTRAVENOUS

## 2017-09-16 MED ORDER — ROCURONIUM BROMIDE 50 MG/5ML IV SOLN
INTRAVENOUS | Status: AC
  Filled 2017-09-16: qty 1

## 2017-09-16 MED ORDER — PHENYLEPHRINE HCL 10 MG/ML IJ SOLN
INTRAMUSCULAR | Status: DC | PRN
  Administered 2017-09-16 (×8): 100 ug via INTRAVENOUS

## 2017-09-16 MED ORDER — SCOPOLAMINE 1 MG/3DAYS TD PT72
MEDICATED_PATCH | TRANSDERMAL | Status: AC
  Administered 2017-09-16: 1.5 mg via TRANSDERMAL
  Filled 2017-09-16: qty 1

## 2017-09-16 MED ORDER — FENTANYL CITRATE (PF) 100 MCG/2ML IJ SOLN
INTRAMUSCULAR | Status: DC | PRN
  Administered 2017-09-16 (×2): 50 ug via INTRAVENOUS

## 2017-09-16 MED ORDER — BUPIVACAINE HCL (PF) 0.5 % IJ SOLN
INTRAMUSCULAR | Status: AC
  Filled 2017-09-16: qty 30

## 2017-09-16 MED ORDER — SUGAMMADEX SODIUM 500 MG/5ML IV SOLN
INTRAVENOUS | Status: DC | PRN
  Administered 2017-09-16: 200 mg via INTRAVENOUS

## 2017-09-16 MED ORDER — ONDANSETRON 4 MG PO TBDP: 4.0000 mg | ORAL_TABLET | Freq: Four times a day (QID) | ORAL | Status: DC | PRN

## 2017-09-16 MED ORDER — FENTANYL CITRATE (PF) 100 MCG/2ML IJ SOLN
25.0000 ug | INTRAMUSCULAR | Status: DC | PRN
  Administered 2017-09-16: 25 ug via INTRAVENOUS

## 2017-09-16 MED ORDER — HYDRALAZINE HCL 20 MG/ML IJ SOLN: 10.0000 mg | INTRAMUSCULAR | Status: DC | PRN

## 2017-09-16 MED ORDER — ONDANSETRON HCL 4 MG/2ML IJ SOLN
INTRAMUSCULAR | Status: AC
  Filled 2017-09-16: qty 2

## 2017-09-16 MED ORDER — CHLORHEXIDINE GLUCONATE CLOTH 2 % EX PADS: 6.0000 | MEDICATED_PAD | Freq: Once | CUTANEOUS | Status: DC

## 2017-09-16 MED ORDER — ACETAMINOPHEN 500 MG PO TABS
1000.0000 mg | ORAL_TABLET | ORAL | Status: AC
  Administered 2017-09-16: 1000 mg via ORAL

## 2017-09-16 MED ORDER — SUGAMMADEX SODIUM 200 MG/2ML IV SOLN
INTRAVENOUS | Status: AC
  Filled 2017-09-16: qty 2

## 2017-09-16 MED ORDER — BUPIVACAINE LIPOSOME 1.3 % IJ SUSP
INTRAMUSCULAR | Status: DC | PRN
  Administered 2017-09-16: 100 mL

## 2017-09-16 MED ORDER — SEVOFLURANE IN SOLN
RESPIRATORY_TRACT | Status: AC
  Filled 2017-09-16: qty 250

## 2017-09-16 MED ORDER — ACETAMINOPHEN 500 MG PO TABS
1000.0000 mg | ORAL_TABLET | Freq: Four times a day (QID) | ORAL | Status: DC
  Administered 2017-09-16 – 2017-09-19 (×10): 1000 mg via ORAL
  Filled 2017-09-16 (×11): qty 2

## 2017-09-16 MED ORDER — GABAPENTIN 300 MG PO CAPS
300.0000 mg | ORAL_CAPSULE | ORAL | Status: AC
  Administered 2017-09-16: 300 mg via ORAL

## 2017-09-16 MED ORDER — LIDOCAINE HCL (PF) 2 % IJ SOLN
INTRAMUSCULAR | Status: AC
  Filled 2017-09-16: qty 10

## 2017-09-16 MED ORDER — HEPARIN SODIUM (PORCINE) 5000 UNIT/ML IJ SOLN
INTRAMUSCULAR | Status: AC
  Administered 2017-09-16: 5000 [IU] via SUBCUTANEOUS
  Filled 2017-09-16: qty 1

## 2017-09-16 MED ORDER — DEXAMETHASONE SODIUM PHOSPHATE 10 MG/ML IJ SOLN
INTRAMUSCULAR | Status: DC | PRN
  Administered 2017-09-16: 10 mg via INTRAVENOUS

## 2017-09-16 MED ORDER — BUPIVACAINE LIPOSOME 1.3 % IJ SUSP
INTRAMUSCULAR | Status: AC
  Filled 2017-09-16: qty 20

## 2017-09-16 MED ORDER — SCOPOLAMINE 1 MG/3DAYS TD PT72
1.0000 | MEDICATED_PATCH | TRANSDERMAL | Status: DC
Start: 2017-09-16 — End: 2017-09-19
  Administered 2017-09-16: 1.5 mg via TRANSDERMAL
  Filled 2017-09-16: qty 1

## 2017-09-16 MED ORDER — LIDOCAINE HCL (CARDIAC) 20 MG/ML IV SOLN
INTRAVENOUS | Status: DC | PRN
  Administered 2017-09-16: 40 mg via INTRAVENOUS

## 2017-09-16 MED ORDER — ONDANSETRON HCL 4 MG/2ML IJ SOLN
INTRAMUSCULAR | Status: DC | PRN
  Administered 2017-09-16: 4 mg via INTRAVENOUS

## 2017-09-16 MED ORDER — ONDANSETRON HCL 4 MG/2ML IJ SOLN: 4.0000 mg | Freq: Once | INTRAMUSCULAR | Status: DC | PRN

## 2017-09-16 MED ORDER — EPHEDRINE SULFATE 50 MG/ML IJ SOLN
INTRAMUSCULAR | Status: DC | PRN
  Administered 2017-09-16 (×2): 10 mg via INTRAVENOUS

## 2017-09-16 MED ORDER — PANTOPRAZOLE SODIUM 40 MG IV SOLR
40.0000 mg | Freq: Every day | INTRAVENOUS | Status: DC
  Administered 2017-09-16 – 2017-09-18 (×2): 40 mg via INTRAVENOUS
  Filled 2017-09-16 (×2): qty 40

## 2017-09-16 MED ORDER — ONDANSETRON HCL 4 MG/2ML IJ SOLN
4.0000 mg | Freq: Four times a day (QID) | INTRAMUSCULAR | Status: DC | PRN
  Administered 2017-09-16 (×2): 4 mg via INTRAVENOUS
  Filled 2017-09-16 (×2): qty 2

## 2017-09-16 MED ORDER — HEPARIN SODIUM (PORCINE) 5000 UNIT/ML IJ SOLN
5000.0000 [IU] | Freq: Once | INTRAMUSCULAR | Status: AC
  Administered 2017-09-16: 5000 [IU] via SUBCUTANEOUS

## 2017-09-16 MED ORDER — SUCCINYLCHOLINE CHLORIDE 20 MG/ML IJ SOLN
INTRAMUSCULAR | Status: DC | PRN
  Administered 2017-09-16: 100 mg via INTRAVENOUS

## 2017-09-16 MED ORDER — PROPOFOL 10 MG/ML IV BOLUS
INTRAVENOUS | Status: DC | PRN
  Administered 2017-09-16: 130 mg via INTRAVENOUS

## 2017-09-16 MED ORDER — OXYCODONE HCL 5 MG PO TABS
5.0000 mg | ORAL_TABLET | ORAL | Status: DC | PRN
  Administered 2017-09-17 (×2): 10 mg via ORAL
  Administered 2017-09-17: 5 mg via ORAL
  Filled 2017-09-16 (×2): qty 2
  Filled 2017-09-16: qty 1

## 2017-09-16 MED ORDER — GABAPENTIN 300 MG PO CAPS
ORAL_CAPSULE | ORAL | Status: AC
  Administered 2017-09-16: 300 mg via ORAL
  Filled 2017-09-16: qty 1

## 2017-09-16 MED ORDER — ENOXAPARIN SODIUM 40 MG/0.4ML ~~LOC~~ SOLN
40.0000 mg | SUBCUTANEOUS | Status: DC
  Administered 2017-09-17 – 2017-09-19 (×3): 40 mg via SUBCUTANEOUS
  Filled 2017-09-16 (×3): qty 0.4

## 2017-09-16 MED ORDER — SUCCINYLCHOLINE CHLORIDE 20 MG/ML IJ SOLN
INTRAMUSCULAR | Status: AC
  Filled 2017-09-16: qty 1

## 2017-09-16 MED ORDER — DEXTROSE IN LACTATED RINGERS 5 % IV SOLN
INTRAVENOUS | Status: DC
  Administered 2017-09-16 – 2017-09-17 (×2): via INTRAVENOUS

## 2017-09-16 MED ORDER — FAMOTIDINE 20 MG PO TABS
ORAL_TABLET | ORAL | Status: AC
  Administered 2017-09-16: 20 mg via ORAL
  Filled 2017-09-16: qty 1

## 2017-09-16 MED ORDER — FAMOTIDINE 20 MG PO TABS
20.0000 mg | ORAL_TABLET | Freq: Once | ORAL | Status: AC
  Administered 2017-09-16: 20 mg via ORAL

## 2017-09-16 MED ORDER — PROPOFOL 500 MG/50ML IV EMUL
INTRAVENOUS | Status: AC
  Filled 2017-09-16: qty 50

## 2017-09-16 SURGICAL SUPPLY — 74 items
"PENCIL ELECTRO HAND CTR " (MISCELLANEOUS) ×1 IMPLANT
APPLIER CLIP 5 13 M/L LIGAMAX5 (MISCELLANEOUS)
BLADE SURG SZ10 CARB STEEL (BLADE) ×3 IMPLANT
BULB RESERV EVAC DRAIN JP 100C (MISCELLANEOUS) IMPLANT
CANISTER SUCT 1200ML W/VALVE (MISCELLANEOUS) ×3 IMPLANT
CATH ROBINSON RED A/P 16FR (CATHETERS) ×3 IMPLANT
CHLORAPREP W/TINT 26ML (MISCELLANEOUS) ×3 IMPLANT
CLIP APPLIE 5 13 M/L LIGAMAX5 (MISCELLANEOUS) IMPLANT
CNTNR SPEC 2.5X3XGRAD LEK (MISCELLANEOUS) ×1
CONT SPEC 4OZ STER OR WHT (MISCELLANEOUS) ×2
CONTAINER SPEC 2.5X3XGRAD LEK (MISCELLANEOUS) ×1 IMPLANT
DECANTER SPIKE VIAL GLASS SM (MISCELLANEOUS) ×3 IMPLANT
DEFOGGER SCOPE WARMER CLEARIFY (MISCELLANEOUS) ×3 IMPLANT
DERMABOND ADVANCED (GAUZE/BANDAGES/DRESSINGS) ×4
DERMABOND ADVANCED .7 DNX12 (GAUZE/BANDAGES/DRESSINGS) ×2 IMPLANT
DRAIN CHANNEL JP 19F (MISCELLANEOUS) IMPLANT
DRAPE INCISE IOBAN 66X45 STRL (DRAPES) ×3 IMPLANT
DRAPE LEGGINS SURG 28X43 STRL (DRAPES) ×3 IMPLANT
DRAPE UNDER BUTTOCK W/FLU (DRAPES) ×3 IMPLANT
ELECT BLADE 6.5 EXT (BLADE) ×2 IMPLANT
ELECT CAUTERY BLADE 6.4 (BLADE) ×3 IMPLANT
ELECT REM PT RETURN 9FT ADLT (ELECTROSURGICAL) ×3
ELECTRODE REM PT RTRN 9FT ADLT (ELECTROSURGICAL) ×1 IMPLANT
GELPORT LAPAROSCOPIC (MISCELLANEOUS) ×2 IMPLANT
GLOVE BIO SURGEON STRL SZ7 (GLOVE) ×6 IMPLANT
GOWN STRL REUS W/ TWL LRG LVL3 (GOWN DISPOSABLE) ×4 IMPLANT
GOWN STRL REUS W/TWL LRG LVL3 (GOWN DISPOSABLE) ×8
HANDLE SUCTION POOLE (INSTRUMENTS) ×1 IMPLANT
HANDLE YANKAUER SUCT BULB TIP (MISCELLANEOUS) ×3 IMPLANT
HOLDER FOLEY CATH W/STRAP (MISCELLANEOUS) ×3 IMPLANT
IRRIGATION STRYKERFLOW (MISCELLANEOUS) ×1 IMPLANT
IRRIGATOR STRYKERFLOW (MISCELLANEOUS) ×3
IV NS 1000ML (IV SOLUTION) ×2
IV NS 1000ML BAXH (IV SOLUTION) ×1 IMPLANT
KIT PINK PAD W/HEAD ARE REST (MISCELLANEOUS) ×3
KIT PINK PAD W/HEAD ARM REST (MISCELLANEOUS) ×1 IMPLANT
KIT TURNOVER CYSTO (KITS) ×3 IMPLANT
L-HOOK LAP DISP 36CM (ELECTROSURGICAL) ×3
LHOOK LAP DISP 36CM (ELECTROSURGICAL) ×1 IMPLANT
NEEDLE HYPO 22GX1.5 SAFETY (NEEDLE) ×3 IMPLANT
NS IRRIG 1000ML POUR BTL (IV SOLUTION) ×3 IMPLANT
PACK COLON CLEAN CLOSURE (MISCELLANEOUS) ×3 IMPLANT
PACK LAP CHOLECYSTECTOMY (MISCELLANEOUS) ×3 IMPLANT
PAD PREP 24X41 OB/GYN DISP (PERSONAL CARE ITEMS) ×3 IMPLANT
PENCIL ELECTRO HAND CTR (MISCELLANEOUS) ×3 IMPLANT
RELOAD STAPLE 60 2.6 WHT THN (STAPLE) IMPLANT
RELOAD STAPLE 60 3.6 BLU REG (STAPLE) IMPLANT
RELOAD STAPLER BLUE 60MM (STAPLE) ×2 IMPLANT
RELOAD STAPLER WHITE 60MM (STAPLE) ×3 IMPLANT
SCISSORS METZENBAUM CVD 33 (INSTRUMENTS) IMPLANT
SHEARS HARMONIC ACE PLUS 36CM (ENDOMECHANICALS) ×3 IMPLANT
SLEEVE ENDOPATH XCEL 5M (ENDOMECHANICALS) ×3 IMPLANT
SOL PREP PVP 2OZ (MISCELLANEOUS) ×3
SOLUTION PREP PVP 2OZ (MISCELLANEOUS) ×1 IMPLANT
SPONGE LAP 18X18 5 PK (GAUZE/BANDAGES/DRESSINGS) ×6 IMPLANT
STAPLE ECHEON FLEX 60 POW ENDO (STAPLE) ×2 IMPLANT
STAPLER CIRCULAR 29MM (STAPLE) ×2 IMPLANT
STAPLER RELOAD BLUE 60MM (STAPLE) ×6
STAPLER RELOAD WHITE 60MM (STAPLE) ×9
STAPLER SKIN PROX 35W (STAPLE) IMPLANT
SUCT SIGMOIDOSCOPE TIP 18 W/TU (SUCTIONS) IMPLANT
SUCTION POOLE HANDLE (INSTRUMENTS) ×3
SURGILUBE 2OZ TUBE FLIPTOP (MISCELLANEOUS) ×3 IMPLANT
SUT MNCRL AB 4-0 PS2 18 (SUTURE) ×6 IMPLANT
SUT PDS AB 0 CT1 27 (SUTURE) ×6 IMPLANT
SUT SILK 0 SH 30 (SUTURE) ×2 IMPLANT
SUT SILK 2 0SH CR/8 30 (SUTURE) ×3 IMPLANT
SYR 30ML LL (SYRINGE) ×6 IMPLANT
SYRINGE IRR TOOMEY STRL 70CC (SYRINGE) ×3 IMPLANT
TOWEL OR 17X26 4PK STRL BLUE (TOWEL DISPOSABLE) ×3 IMPLANT
TRAY FOLEY W/METER SILVER 16FR (SET/KITS/TRAYS/PACK) ×3 IMPLANT
TROCAR XCEL 12X100 BLDLESS (ENDOMECHANICALS) ×3 IMPLANT
TROCAR XCEL NON-BLD 5MMX100MML (ENDOMECHANICALS) ×3 IMPLANT
TUBING INSUF HEATED (TUBING) ×3 IMPLANT

## 2017-09-16 NOTE — Interval H&P Note (Signed)
History and Physical Interval Note:  09/16/2017 7:23 AM  Lindsay Schwartz  has presented today for surgery, with the diagnosis of colon mass  The various methods of treatment have been discussed with the patient and family. After consideration of risks, benefits and other options for treatment, the patient has consented to  Procedure(s) with comments: LAPAROSCOPIC SIGMOID COLECTOMY (N/A) - Lithotomy position , both arms tucked as a surgical intervention .  The patient's history has been reviewed, patient examined, no change in status, stable for surgery.  I have reviewed the patient's chart and labs.  Questions were answered to the patient's satisfaction.     [place]

## 2017-09-16 NOTE — Op Note (Addendum)
PROCEDURES: 1. Laparoscopic lysis of adhesions 2. Laparoscopic low anterior resection 3. Laparoscopic takedown of splenic flexure 4. Umbilical hernia repair  Pre-operative Diagnosis: Sigmoid Mass  Post-operative Diagnosis: same  Surgeon: Marjory Lies Tahari Clabaugh   Assistants: Dr. Genevive Bi required for complex procedure, exposure and inability for a competent assistant  Anesthesia: General endotracheal anesthesia  ASA Class: 2   Surgeon: Caroleen Hamman , MD FACS  Anesthesia: Gen. with endotracheal tub  Findings: Tattoo  Within the sigmoid colon, no evidence of metastatic disease within the abdomen Tension free anastomosis w/o anastomotic leak  Estimated Blood Loss: 50cc         Drains: none         Specimens: sigmoid colon          Complications: none               Condition: stable  Procedure Details  The patient was seen again in the Holding Room. The benefits, complications, treatment options, and expected outcomes were discussed with the patient. The risks of bleeding, infection, recurrence of symptoms, failure to resolve symptoms,  bowel injury, any of which could require further surgery were reviewed with the patient.   The patient was taken to Operating Room, identified as Lindsay Schwartz and the procedure verified.  A Time Out was held and the above information confirmed.  Prior to the induction of general anesthesia, antibiotic prophylaxis was administered. VTE prophylaxis was in place. General endotracheal anesthesia was then administered and tolerated well. After the induction, the abdomen was prepped with Chloraprep and draped in the sterile fashion. The patient was positioned in the supine position. Prior to the induction of general anesthesia, antibiotic prophylaxis was administered. VTE prophylaxis was in place. General endotracheal anesthesia was then administered and tolerated well. After the induction, the abdomen was prepped with Chloraprep and draped in the sterile  fashion. The patient was positioned in lithotomy position. 7 cm incision was created as a midline mini laparotomy. Hernia identified and sac excised The abdominal cavity was entered under direct visualization and the GelPort device was placed. A 5 mm port was placed in the suprapubic area under direct visualization and pneumoperitoneum was obtained. There were dense adhesions from the omentum to the abdominal wall that where lysed in the standard fashion with the Harmonic scalpel. We also were able to place a 12 mm port in the right lower quadrant and a 5 mm port in the left lower quadrant under direct visualization. There was significant adhesive disease in the pelvis from the sigmoid to the pelvic wall and also from the sigmoid to the ovary and the uterus. This adhesions were lysed with a combination of finger fracturing and Harmonic scalpel. The white line of pot was identified and divided and we mobilized the descending colon IN a lateral to medial fashion. We preserved the ureter at all times. We were also able to mobilize the splenic flexure using Harmonic scalpel in the standard fashion. We identified the takeoff of the inferior mesenteric artery dissected the pedicle and divided using a 60 mm vascular echelon stapler in the standard fashion. Using the Harmonic's scalpel were able to divide the mesorectum and and also divided proximal to the mesentery of the descending colon. Once we have an adequate visualization and mobilization we divided the sigmoid colon distally at the junction of the rectosigmoid area with multiple blue loads using the echelon stapler. We'll remove the GelPort and visualized the colon in a direct fashion. An divided at the mid  descending colon with standard 60 mm blue load. We opened the stopped and measure the diameter of the bowel. A 29 mm dilator was perfect size. A pursestring was used after in certain the anvil device. Dr. Genevive Bi was able to pass a 29 mm standard EEA stapler  device through the anus and passed the device through the end of the rectal stump. Under direct visualization we perform an end to end anastomosis with the EEA device. A leak test was performed inflating the colon with a Toomey syringe and a rubber catheter. No evidence of leak was observed. There was also adequate hemostasis. A 19 Blake drain was placed in the pelvis. We were able to mobilize the omentum and I created an omental flap to attach it to the anastomosis. . All the laparoscopic ports were removed and a second look showed no evidence of any bleeding or any other injuries. We changed gloves and place a new tray to close the abdomen with a 0 PDS suture in a running fashion incorporating the umbilical defect. The skin was closed with 4-0 Monocryl. Liposomal Marcaine was injected on all incision sites under direct visualization. Dermabond was used to coat all the skin incisions. Needle and laparotomy count were correct and there were no immediate complications  Caroleen Hamman, MD, FACS

## 2017-09-16 NOTE — Anesthesia Procedure Notes (Signed)
Procedure Name: Intubation Date/Time: 09/16/2017 9:12 AM Performed by: Allean Found, CRNA Pre-anesthesia Checklist: Patient identified, Emergency Drugs available, Suction available, Patient being monitored and Timeout performed Patient Re-evaluated:Patient Re-evaluated prior to induction Oxygen Delivery Method: Circle system utilized Preoxygenation: Pre-oxygenation with 100% oxygen Induction Type: IV induction Ventilation: Mask ventilation without difficulty Laryngoscope Size: McGraph and 3 Grade View: Grade I Tube type: Oral Tube size: 7.0 mm Number of attempts: 1 Airway Equipment and Method: Stylet Placement Confirmation: ETT inserted through vocal cords under direct vision,  positive ETCO2 and breath sounds checked- equal and bilateral Secured at: 22 cm Tube secured with: Tape Dental Injury: Teeth and Oropharynx as per pre-operative assessment  Difficulty Due To: Difficult Airway- due to anterior larynx

## 2017-09-16 NOTE — Transfer of Care (Signed)
Immediate Anesthesia Transfer of Care Note  Patient: Lindsay Schwartz  Procedure(s) Performed: LAPAROSCOPIC SIGMOID COLECTOMY (N/A )  Patient Location:    Anesthesia Type:General  Level of Consciousness: sedated  Airway & Oxygen Therapy: Patient Spontanous Breathing and Patient connected to face mask oxygen  Post-op Assessment: Report given to RN and Post -op Vital signs reviewed and stable  Post vital signs: Reviewed and stable  Last Vitals:  Vitals Value Taken Time  BP 113/75 09/16/2017  1:01 PM  Temp 36.6 C 09/16/2017  1:01 PM  Pulse 80 09/16/2017  1:07 PM  Resp 15 09/16/2017  1:07 PM  SpO2 95 % 09/16/2017  1:07 PM  Vitals shown include unvalidated device data.  Last Pain:  Vitals:   09/16/17 1301  PainSc: Asleep         Complications: No apparent anesthesia complications

## 2017-09-16 NOTE — Anesthesia Preprocedure Evaluation (Addendum)
Anesthesia Evaluation  Patient identified by MRN, date of birth, ID band Patient awake    Reviewed: Allergy & Precautions, H&P , NPO status , Patient's Chart, lab work & pertinent test results  History of Anesthesia Complications (+) PONV, PROLONGED EMERGENCE and history of anesthetic complications  Airway Mallampati: III  TM Distance: <3 FB Neck ROM: full    Dental  (+) Chipped, Poor Dentition   Pulmonary neg shortness of breath, former smoker,    Pulmonary exam normal        Cardiovascular Exercise Tolerance: Good hypertension, (-) angina+ Peripheral Vascular Disease  (-) Past MI and (-) DOE Normal cardiovascular exam     Neuro/Psych TIAnegative psych ROS   GI/Hepatic negative GI ROS, Neg liver ROS, neg GERD  ,  Endo/Other  negative endocrine ROS  Renal/GU negative Renal ROS  negative genitourinary   Musculoskeletal negative musculoskeletal ROS (+)   Abdominal   Peds  Hematology negative hematology ROS (+)   Anesthesia Other Findings Past Medical History: No date: Cysts of both ovaries No date: Hypertension  Past Surgical History: No date: carpel tunnel  BMI    Body Mass Index:  31.64 kg/m      Reproductive/Obstetrics negative OB ROS                             Anesthesia Physical  Anesthesia Plan  ASA: III  Anesthesia Plan: General   Post-op Pain Management:    Induction: Intravenous  PONV Risk Score and Plan:   Airway Management Planned: Oral ETT  Additional Equipment:   Intra-op Plan:   Post-operative Plan: Extubation in OR  Informed Consent: I have reviewed the patients History and Physical, chart, labs and discussed the procedure including the risks, benefits and alternatives for the proposed anesthesia with the patient or authorized representative who has indicated his/her understanding and acceptance.   Dental Advisory Given  Plan Discussed with: CRNA  and Surgeon  Anesthesia Plan Comments: (Patient consented for risks of anesthesia including but not limited to:  - adverse reactions to medications - risk of intubation if required - damage to teeth, lips or other oral mucosa - sore throat or hoarseness - Damage to heart, brain, lungs or loss of life  Patient voiced understanding.)       Anesthesia Quick Evaluation

## 2017-09-16 NOTE — Anesthesia Post-op Follow-up Note (Signed)
Anesthesia QCDR form completed.        

## 2017-09-17 ENCOUNTER — Encounter: Payer: Self-pay | Admitting: Surgery

## 2017-09-17 LAB — PHOSPHORUS: PHOSPHORUS: 2.9 mg/dL (ref 2.5–4.6)

## 2017-09-17 LAB — CBC
HEMATOCRIT: 36.9 % (ref 35.0–47.0)
Hemoglobin: 13 g/dL (ref 12.0–16.0)
MCH: 28.8 pg (ref 26.0–34.0)
MCHC: 35.4 g/dL (ref 32.0–36.0)
MCV: 81.4 fL (ref 80.0–100.0)
Platelets: 223 10*3/uL (ref 150–440)
RBC: 4.53 MIL/uL (ref 3.80–5.20)
RDW: 14.4 % (ref 11.5–14.5)
WBC: 13.4 10*3/uL — AB (ref 3.6–11.0)

## 2017-09-17 LAB — BASIC METABOLIC PANEL
ANION GAP: 5 (ref 5–15)
BUN: 7 mg/dL (ref 6–20)
CALCIUM: 8.4 mg/dL — AB (ref 8.9–10.3)
CO2: 26 mmol/L (ref 22–32)
Chloride: 104 mmol/L (ref 101–111)
Creatinine, Ser: 0.77 mg/dL (ref 0.44–1.00)
GFR calc non Af Amer: >60 mL/min (ref >60)
Glucose, Bld: 150 mg/dL — ABNORMAL HIGH (ref 65–99)
POTASSIUM: 3.6 mmol/L (ref 3.5–5.1)
Sodium: 135 mmol/L (ref 135–145)

## 2017-09-17 LAB — HIV ANTIBODY (ROUTINE TESTING W REFLEX): HIV Screen 4th Generation wRfx: NONREACTIVE

## 2017-09-17 LAB — MAGNESIUM: Magnesium: 1.9 mg/dL (ref 1.7–2.4)

## 2017-09-17 MED ORDER — GABAPENTIN 600 MG PO TABS
300.0000 mg | ORAL_TABLET | Freq: Three times a day (TID) | ORAL | Status: DC
  Administered 2017-09-17 – 2017-09-18 (×6): 300 mg via ORAL
  Filled 2017-09-17 (×6): qty 1

## 2017-09-17 NOTE — Progress Notes (Signed)
POD # 1 Doing well Nausea yesterday has improved Taking clears No flatus AVSS Good UO  PE NAD Abd: incision c/d/i, no infection Ext: well perfused and no edema  A/P Doing well DC foley Keep clears Mobilize

## 2017-09-17 NOTE — Progress Notes (Signed)
Foley removed at 1120

## 2017-09-18 MED ORDER — KETOROLAC TROMETHAMINE 15 MG/ML IJ SOLN
30.0000 mg | Freq: Four times a day (QID) | INTRAMUSCULAR | Status: DC
  Administered 2017-09-18 – 2017-09-19 (×4): 30 mg via INTRAVENOUS
  Filled 2017-09-18 (×4): qty 2

## 2017-09-18 NOTE — Progress Notes (Signed)
POD # 2 Doing well Taking clears No flatus AVSS Good UO Pain improving ambulating  PE NAD Abd: incision c/d/i, no infection, dec BS Ext: well perfused and no edema  A/P Doing well Keep clears Mobilize

## 2017-09-19 MED ORDER — OXYCODONE HCL 5 MG PO TABS: 5.0000 mg | ORAL_TABLET | Freq: Four times a day (QID) | ORAL | 0 refills | Status: DC | PRN

## 2017-09-19 NOTE — Discharge Summary (Signed)
Patient ID: Lindsay Schwartz MRN: [id number] DOB/AGE: Mar 13, 1956 62 y.o.  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  She underwent a laparoscopic low anterior resection without complication and an uneventful postoperative course.  Her diet was advanced when she started passing gas and she tolerated this well.  The time of discharge she was ambulating, tolerating regular diet and having bowel movements.  Her physical exam showed a female no acute distress.  Awake and alert.  Abdomen: Incisions healing well without evidence of infection.  No peritonitis.  Extremities: No edema well perfused.  Condition of the patient at time of discharge was stable    Disposition: Discharge disposition: 01-Home or Self Care       Discharge Instructions    Call MD for:  difficulty breathing, headache or visual disturbances   Complete by:  As directed    Call MD for:  extreme fatigue   Complete by:  As directed    Call MD for:  hives   Complete by:  As directed    Call MD for:  persistant dizziness or light-headedness   Complete by:  As directed    Call MD for:  persistant nausea and vomiting   Complete by:  As directed    Call MD for:  redness, tenderness, or signs of infection (pain, swelling, redness, odor or green/yellow discharge around incision site)   Complete by:  As directed    Call MD for:  severe uncontrolled pain   Complete by:  As directed    Call MD for:  temperature >100.4   Complete by:  As directed    Diet - low sodium heart healthy   Complete by:  As directed    Discharge instructions   Complete by:  As directed    Shower daily   Increase activity slowly   Complete by:  As directed      Allergies as of 09/19/2017       Reactions   Benadryl [diphenhydramine] Other (See Comments)   Excessive sleepiness/unable to be awaken (took 4 days to wake up)   Codeine Other (See Comments)   Causes mental disorientation--loopy, "brain does not function"   Vicodin [hydrocodone-acetaminophen] Other (See Comments)   Sees clouds in her head, gets sick and dizzy   Penicillins Rash   Has patient had a PCN reaction causing immediate rash, facial/tongue/throat swelling, SOB or lightheadedness with hypotension: Yes Has patient had a PCN reaction causing severe rash involving mucus membranes or skin necrosis: No Has patient had a PCN reaction that required hospitalization: No Has patient had a PCN reaction occurring within the last 10 years: No If all of the above answers are "NO", then may proceed with Cephalosporin use.   Phenergan [promethazine Hcl] Other (See Comments)   Excessive sleepiness/unable to be awaken   Sulfur Other (See Comments)   Childhood reaction unknown      Medication List    TAKE these medications   amLODipine 10 MG tablet Commonly known as:  NORVASC Take 10 mg by mouth daily.   aspirin EC 81 MG tablet Take 81 mg by mouth every 4 (four) hours as needed (for headaches/pain.).   bisacodyl 5 MG EC tablet Generic drug:  bisacodyl Take 4 tablets at 8AM the  day before your surgery.   erythromycin base 500 MG tablet Commonly known as:  E-MYCIN Take 1 tablet (500 mg total) by mouth 3 (three) times daily. Take 2 tablets at 8:00 AM, 2:00 PM and 8:00 PM the day before surgery.   neomycin 500 MG tablet Commonly known as:  MYCIFRADIN Take 1 tablet (500 mg total) by mouth 3 (three) times daily. Take 2 tablets at 8:00 AM, 2:00 PM and 8:00 PM the day before surgery.   oxyCODONE 5 MG immediate release tablet Commonly known as:  Oxy IR/ROXICODONE Take 1 tablet (5 mg total) by mouth every 6 (six) hours as needed for severe pain.   potassium chloride SA 20 MEQ tablet Commonly known as:   K-DUR,KLOR-CON Take 1 tablet (20 mEq total) by mouth 2 (two) times daily.      Follow-up Information    Clois Montavon, [place] F, MD Follow up in 2 week(s).   Specialty:  General Surgery Contact information: [place] [address] [phone]            Caroleen Hamman, MD FACS

## 2017-09-20 ENCOUNTER — Telehealth: Payer: Self-pay | Admitting: Surgery

## 2017-09-20 NOTE — Telephone Encounter (Signed)
Patient recently had surgery on 09/16/17 and has some questions about some of the foods she should be eating, patient said she is feeling a little nausea, and hasn't had a bowel movement since she left the hospital. Please call patient and advise.

## 2017-09-20 NOTE — Telephone Encounter (Signed)
Spoke with patient. She states that she is scared to eat since half of her colon has been removed. I explained that she does not have any diet restrictions listed and that she can eat/drink. She states she has been on liquid diet for 3 weeks due to prep for Colonoscopy and then surgery. We discussed that she is not going to have formed stools due to not eating solid foods, however she said she has a little bowel movement here and there but it is watery. She stated she feels knots in her stomach /  intestines  and wanted to know if it was due to the surgery.  Patient was added to schedule 09/23/17 with Dr.Pabon. Patient did not want to wait 2 weeks to see a doctor after just having surgery.  She denies fever, chills, vomiting at this time.

## 2017-09-21 LAB — SURGICAL PATHOLOGY

## 2017-09-21 NOTE — Anesthesia Postprocedure Evaluation (Signed)
Anesthesia Post Note  Patient: Lindsay Schwartz  Procedure(s) Performed: LAPAROSCOPIC SIGMOID COLECTOMY (N/A )  Patient location during evaluation: PACU Anesthesia Type: General Level of consciousness: awake and alert and oriented Pain management: pain level controlled Vital Signs Assessment: post-procedure vital signs reviewed and stable Respiratory status: spontaneous breathing Cardiovascular status: blood pressure returned to baseline Anesthetic complications: no     Last Vitals:  Vitals:   09/18/17 2125 09/19/17 0520  BP: (!) 100/51 116/75  Pulse: 71 73  Resp: 18 20  Temp: 36.8 C 36.6 C  SpO2: 95% 97%    Last Pain:  Vitals:   09/19/17 0800  TempSrc:   PainSc: 0-No pain                 Deana Krock

## 2017-09-23 ENCOUNTER — Ambulatory Visit (INDEPENDENT_AMBULATORY_CARE_PROVIDER_SITE_OTHER): Payer: BLUE CROSS/BLUE SHIELD | Admitting: Surgery

## 2017-09-23 ENCOUNTER — Encounter: Payer: Self-pay | Admitting: Surgery

## 2017-09-23 VITALS — BP 145/97 | HR 74 | Temp 97.4°F | Wt 165.0 lb

## 2017-09-23 DIAGNOSIS — Z09 Encounter for follow-up examination after completed treatment for conditions other than malignant neoplasm: Secondary | ICD-10-CM

## 2017-09-23 NOTE — Progress Notes (Signed)
S/p lap LAR Path d/w pt no evidence of cancer Negative margins She is doing very well Minimal pain Taking Po Passing flatus  PE NAD Abd: incisions c./d/i no infection Soft , mild appropriate incisional tenderness, no peritonitis  A/P Doing well Path d/w her in detail RTC prn F/U Dr. Genevive Bi regarding Pulm masses

## 2017-09-23 NOTE — Patient Instructions (Signed)
Please give us a call in case you have any questions or concerns.  

## 2017-10-04 ENCOUNTER — Encounter: Payer: BLUE CROSS/BLUE SHIELD | Admitting: Surgery

## 2017-10-07 ENCOUNTER — Telehealth: Payer: Self-pay

## 2017-10-07 NOTE — Telephone Encounter (Signed)
Cary-Lyn from Interventional [facility]iology called stating that patient's CT Guided Lung Biopsy is scheduled to be done on 10/21/2017.

## 2017-10-07 NOTE — Telephone Encounter (Signed)
Called and spoke to [place] from [facility] to let her know that the patient is ready to do her CT Guided Lung Biopsy. I asked if it could be rescheduled and I was told that they will call the patient to schedule it.

## 2017-10-13 ENCOUNTER — Ambulatory Visit (INDEPENDENT_AMBULATORY_CARE_PROVIDER_SITE_OTHER): Payer: BLUE CROSS/BLUE SHIELD | Admitting: Surgery

## 2017-10-13 ENCOUNTER — Encounter: Payer: Self-pay | Admitting: Surgery

## 2017-10-13 VITALS — BP 131/83 | HR 80 | Resp 14 | Ht 62.0 in | Wt 167.0 lb

## 2017-10-13 DIAGNOSIS — Z4889 Encounter for other specified surgical aftercare: Secondary | ICD-10-CM

## 2017-10-13 NOTE — Patient Instructions (Addendum)
Please continue to wash the incision area with soap and water and pat it dry.  You may continue to take Colace daily once or twice daily as needed. We would like for you to try Magnesium citrate or Milk of Magnesium as needed  for your constipation.   Constipation, Adult Constipation is when a person:  Poops (has a bowel movement) fewer times in a week than normal.  Has a hard time pooping.  Has poop that is dry, hard, or bigger than normal.  Follow these instructions at home: Eating and drinking   Eat foods that have a lot of fiber, such as: ? Fresh fruits and vegetables. ? Whole grains. ? Beans.  Eat less of foods that are high in fat, low in fiber, or overly processed, such as: ? [place] fries. ? Hamburgers. ? Cookies. ? Candy. ? Soda.  Drink enough fluid to keep your pee (urine) clear or pale yellow. General instructions  Exercise regularly or as told by your doctor.  Go to the restroom when you feel like you need to poop. Do not hold it in.  Take over-the-counter and prescription medicines only as told by your doctor. These include any fiber supplements.  Do pelvic floor retraining exercises, such as: ? Doing deep breathing while relaxing your lower belly (abdomen). ? Relaxing your pelvic floor while pooping.  Watch your condition for any changes.  Keep all follow-up visits as told by your doctor. This is important. Contact a doctor if:  You have pain that gets worse.  You have a fever.  You have not pooped for 4 days.  You throw up (vomit).  You are not hungry.  You lose weight.  You are bleeding from the anus.  You have thin, pencil-like poop (stool). Get help right away if:  You have a fever, and your symptoms suddenly get worse.  You leak poop or have blood in your poop.  Your belly feels hard or bigger than normal (is bloated).  You have very bad belly pain.  You feel dizzy or you faint. This information is not intended to replace  advice given to you by your health care provider. Make sure you discuss any questions you have with your health care provider. Document Released: 11/11/2007 Document Revised: 12/13/2015 Document Reviewed: 11/13/2015 Elsevier Interactive Patient Education  [employer].  would like for you to try Magnesium citrate or Milk of Magnesium  for your constipation.

## 2017-10-13 NOTE — Progress Notes (Signed)
Surgical Clinic Progress/Follow-up Note   HPI:  62 y.o. Female presents to clinic for additional post-op follow-up 1 month s/p laparoscope-assisted LAR ([place], 09/16/2017). Patient was last seen by Dr. Dahlia Byes 4/18 and was advised to follow-up as needed as well as with Dr. Genevive Bi regarding pulmonary masses. She requested this additional evaluation due to her concern regarding a small focus of fibrinous exudate at the lower third of her midline specimen extraction incision when the glue fell off. She also complains she has not passed a BM x 3 days despite taking Senna x 3 days, chosen by patient (not prescribed by physician or similar). Otherwise, patient continues to report complete resolution of any peri-incisional pain and has been tolerating regular diet with +flatus and denies N/V, fever/chills, CP, or SOB.  Review of Systems:  Constitutional: denies fever/chills  Respiratory: denies shortness of breath, wheezing  Cardiovascular: denies chest pain, palpitations  Gastrointestinal: abdominal pain, N/V, and bowel function as per interval history Skin: Denies any other rashes or skin discolorations except post-surgical wounds as per interval history  Vital Signs:  BP 131/83   Pulse 80   Resp 14   Ht 5\' 2"  (1.575 m)   Wt 167 lb (75.8 kg)   BMI 30.54 kg/m    Physical Exam:  Constitutional:  -- Overweight body habitus  -- Awake, alert, and oriented x3  Pulmonary:  -- No crackles -- Equal breath sounds bilaterally -- Breathing non-labored at rest Cardiovascular:  -- S1, S2 present  -- No pericardial rubs  Gastrointestinal:  -- Soft and non-distended and non-tender to palpation, no guarding/rebound tenderness -- Post-surgical incisions all well-approximated with just a small lower third midline incision focus of fibrinous exudate with pink healthy underlying granulation tissue without surrounding erythema or drainage -- No abdominal masses appreciated, pulsatile or otherwise   Musculoskeletal / Integumentary:  -- Wounds or skin discoloration: None appreciated except post-surgical incisions as described above (GI) -- Extremities: B/L UE and LE FROM, hands and feet warm   Assessment:  62 y.o. yo Female with a problem list including...  Patient Active Problem List   Diagnosis Date Noted  . S/P laparoscopic colectomy 09/16/2017  . Polyp of sigmoid colon   . HTN, goal below 140/80 07/07/2017  . Hyperglycemia, unspecified 07/07/2017  . History of CVA (cerebrovascular accident) 12/20/2015  . Mixed hyperlipidemia 12/20/2015  . Prehypertension 12/20/2015  . CVA (cerebral infarction) 10/27/2015  . Abnormal EKG 10/09/2015  . Elevated systolic blood pressure reading without diagnosis of hypertension 10/09/2015    presents to clinic for post-op follow-up evaluation, doing overall well except some constipation and a small focus of uncomplicated lower third midline incision fibrinous exudate without any evidence otherwise of infection, 1 month s/p laparoscope-assisted LAR.Marland Kitchen  Plan:              - maintain hydration and high fiber diet  - take once daily colace to reduce risk of constipation and take Miralax or magnesium citrate once daily until non-constipated BM(s)              - okay to submerge incisions under water (baths, swimming) prn             - gradually resume all activities without restrictions over next 2 weeks             - apply sunblock particularly to incisions with sun exposure to reduce pigmentation of scars             - return to clinic  as needed and with Dr. Genevive Bi for lung masses  - instructed to call office if any questions or concerns  All of the above recommendations were discussed with the patient, and all of patient's questions were answered to her expressed satisfaction.  -- Marilynne Drivers Rosana Hoes, MD, [place]: [facility] for exceptional care. Office: [phone]

## 2017-10-19 ENCOUNTER — Encounter: Payer: Self-pay | Admitting: Surgery

## 2017-10-29 ENCOUNTER — Encounter: Payer: Self-pay | Admitting: Cardiothoracic Surgery

## 2017-10-29 ENCOUNTER — Ambulatory Visit (INDEPENDENT_AMBULATORY_CARE_PROVIDER_SITE_OTHER): Payer: BLUE CROSS/BLUE SHIELD | Admitting: Cardiothoracic Surgery

## 2017-10-29 VITALS — BP 146/84 | HR 83 | Temp 98.2°F | Ht 62.0 in | Wt 169.0 lb

## 2017-10-29 DIAGNOSIS — R918 Other nonspecific abnormal finding of lung field: Secondary | ICD-10-CM

## 2017-10-29 NOTE — Patient Instructions (Signed)
We will send a referral for you to see Dr. Rogue Bussing.

## 2017-10-29 NOTE — Progress Notes (Signed)
  Patient ID: Lindsay Schwartz, female   DOB: Jun 21, 1955, 62 y.o.   MRN: [id number]  HISTORY: She returns today in follow-up.  She is done very well from her colon surgery.  She also had her right middle lung biopsy at [facility].  This returned a carcinoid tumor.  She has no complaints.  She is getting along quite nicely from her colon surgery without any postoperative complications.   Vitals:   10/29/17 0919  BP: (!) 146/84  Pulse: 83  Temp: 98.2 F (36.8 C)  SpO2: 98%     EXAM:    Resp: Lungs are clear bilaterally.  No respiratory distress, normal effort. Heart:  Regular without murmurs Abd:  Abdomen is soft, non distended and non tender. No masses are palpable.  There is no rebound and no guarding. Her abdominal incision is clean dry and intact without erythema or drainage Neurological: Alert and oriented to person, place, and time. Coordination normal.  Skin: Skin is warm and dry. No rash noted. No diaphoretic. No erythema. No pallor.  Psychiatric: Normal mood and affect. Normal behavior. Judgment and thought content normal.    ASSESSMENT: I again reviewed with her the results of the pathology.  There are several lesions in the lung which makes the possibility that this carcinoid tumor could be multifocal or metastatic.  I thought that I would ask Dr. Lynett Fish in oncology to see the patient for his consideration.   PLAN:   We will make a referral to our oncologist.  Once they have had an opportunity to review her case we can then discuss any surgical or nonsurgical options for therapy.    Nestor Lewandowsky, MD

## 2017-11-08 ENCOUNTER — Telehealth: Payer: Self-pay | Admitting: Internal Medicine

## 2017-11-08 NOTE — Telephone Encounter (Signed)
Spoke to Dr.Oaks; re: referral; okay to see [place] in the week of June 24th.

## 2017-11-29 ENCOUNTER — Ambulatory Visit: Payer: BLUE CROSS/BLUE SHIELD | Admitting: Internal Medicine

## 2017-12-03 ENCOUNTER — Other Ambulatory Visit: Payer: Self-pay

## 2017-12-03 ENCOUNTER — Encounter (INDEPENDENT_AMBULATORY_CARE_PROVIDER_SITE_OTHER): Payer: Self-pay

## 2017-12-03 ENCOUNTER — Encounter: Payer: Self-pay | Admitting: *Deleted

## 2017-12-03 ENCOUNTER — Inpatient Hospital Stay: Payer: BLUE CROSS/BLUE SHIELD

## 2017-12-03 ENCOUNTER — Inpatient Hospital Stay: Payer: BLUE CROSS/BLUE SHIELD | Attending: Internal Medicine | Admitting: Internal Medicine

## 2017-12-03 DIAGNOSIS — R05 Cough: Secondary | ICD-10-CM | POA: Diagnosis not present

## 2017-12-03 DIAGNOSIS — C7A090 Malignant carcinoid tumor of the bronchus and lung: Secondary | ICD-10-CM | POA: Insufficient documentation

## 2017-12-03 LAB — COMPREHENSIVE METABOLIC PANEL
ALT: 19 U/L (ref 0–44)
ANION GAP: 8 (ref 5–15)
AST: 22 U/L (ref 15–41)
Albumin: 4.4 g/dL (ref 3.5–5.0)
Alkaline Phosphatase: 85 U/L (ref 38–126)
BILIRUBIN TOTAL: 1 mg/dL (ref 0.3–1.2)
BUN: 15 mg/dL (ref 6–20)
CALCIUM: 9.1 mg/dL (ref 8.9–10.3)
CO2: 26 mmol/L (ref 22–32)
Chloride: 105 mmol/L (ref 98–111)
Creatinine, Ser: 0.67 mg/dL (ref 0.44–1.00)
GFR calc Af Amer: >60 mL/min (ref >60)
GLUCOSE: 123 mg/dL — AB (ref 70–99)
Potassium: 3.9 mmol/L (ref 3.5–5.1)
Sodium: 139 mmol/L (ref 135–145)
Total Protein: 7.8 g/dL (ref 6.5–8.1)

## 2017-12-03 LAB — CBC WITH DIFFERENTIAL/PLATELET
Basophils Absolute: 0.1 10*3/uL (ref 0–0.1)
Basophils Relative: 1 %
Eosinophils Absolute: 0.2 10*3/uL (ref 0–0.7)
Eosinophils Relative: 3 %
HEMATOCRIT: 41.3 % (ref 35.0–47.0)
Hemoglobin: 14.2 g/dL (ref 12.0–16.0)
LYMPHS PCT: 25 %
Lymphs Abs: 1.7 10*3/uL (ref 1.0–3.6)
MCH: 27.9 pg (ref 26.0–34.0)
MCHC: 34.4 g/dL (ref 32.0–36.0)
MCV: 81 fL (ref 80.0–100.0)
MONO ABS: 0.5 10*3/uL (ref 0.2–0.9)
MONOS PCT: 7 %
NEUTROS ABS: 4.3 10*3/uL (ref 1.4–6.5)
Neutrophils Relative %: 64 %
Platelets: 242 10*3/uL (ref 150–440)
RBC: 5.1 MIL/uL (ref 3.80–5.20)
RDW: 14.5 % (ref 11.5–14.5)
WBC: 6.8 10*3/uL (ref 3.6–11.0)

## 2017-12-03 NOTE — Assessment & Plan Note (Addendum)
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  Clinically, nonfunctional-however ongoing cough [etiology is unclear-question functional carcinoid].   #Long discussion regarding the natural history of carcinoid; and in general the slow-growing nature of the disease.  However in general surgery is recommended for cure.  Patient is reluctant with any further therapeutic options.  #Cough-chronic unclear etiology; question functional carcinoid versus others discussed the importance of gallium PET scan for further evaluation however patient concerned about radiation; wants to hold off for now.  Await urine 5-HIAA  #Patient agrees to blood work; CBC CMP chromogranin A; urine 5-HIAA.  Follow-up in 4 weeks.   Thank you Dr. Genevive Bi for allowing me to participate in the care of your pleasant patient. Please do not hesitate to contact me with questions or concerns in the interim.  Patient also introduced to lung navigator.  # I reviewed the blood work- with the patient in detail; also reviewed the imaging independently [as summarized above]; and with the patient in detail.   Cc; Dr.Oaks/Sparks.

## 2017-12-03 NOTE — Progress Notes (Signed)
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  female has been referred to [place] for further evaluation recommendations for newly diagnosed carcinoid.  Patient states that she had an episode of chest pressure approximately 3 to 4 months ago for which she was evaluated in the emergency room-further work-up including chest x-ray/CT scan showed a right middle lobe/lower lobe lung nodule.  This was further evaluated the PET scan that showed slight uptake; but showed sigmoid uptake.  She went on to have a colonoscopy that showed a large polyp in the sigmoid colon; and subsequently underwent resection.  Patient was then subsequently evaluated by thoracic surgery/referred to have CT-guided biopsy of the right lung nodule-positive for carcinoid.  Patient complains of chronic cough phlegm; denies any wheezing.  Denies any long episodes of night sweats or diarrhea.  No weight loss. NO skin rash/flushing.     Review of Systems  Constitutional: Negative for chills, diaphoresis, fever, malaise/fatigue and weight loss.  HENT: Negative for nosebleeds and sore throat.   Eyes: Negative for double vision.  Respiratory: Positive for cough. Negative for hemoptysis, sputum production, shortness of breath and wheezing.   Cardiovascular: Negative for chest pain, palpitations, orthopnea and leg swelling.  Gastrointestinal: Negative for abdominal pain, blood in stool, constipation, diarrhea, heartburn, melena, nausea and vomiting.  Genitourinary: Negative for dysuria, frequency and urgency.   Musculoskeletal: Negative for back pain and joint pain.  Skin: Negative.  Negative for itching and rash.  Neurological: Negative for dizziness, tingling, focal weakness, weakness and headaches.  Endo/Heme/Allergies: Does not bruise/bleed easily.  Psychiatric/Behavioral: Negative for depression. The patient is not nervous/anxious and does not have insomnia.      MEDICAL HISTORY:  Past Medical History:  Diagnosis Date  . Complication of anesthesia    severe nausea after carpal tunnel that lasted almost 2 weeks. difficult to wake up  . Cysts of both ovaries   . Hypertension   . Mass of colon 08/2017  . Stroke ([place]) 2017  . TIA (transient ischemic attack) 2017   balance problems. no residual now  . Tingling of right upper extremity 2019    SURGICAL HISTORY: Past Surgical History:  Procedure Laterality Date  . carpel tunnel Right   . COLONOSCOPY WITH PROPOFOL N/A 08/27/2017   Procedure: COLONOSCOPY WITH PROPOFOL;  Surgeon: Lollie Sails, MD;  Location: [facility] ENDOSCOPY;  Service: Endoscopy;  Laterality: N/A;  . ESOPHAGOGASTRODUODENOSCOPY (EGD) WITH PROPOFOL N/A 08/27/2017   Procedure: ESOPHAGOGASTRODUODENOSCOPY (EGD) WITH PROPOFOL;  Surgeon: Lollie Sails, MD;  Location: [facility] ENDOSCOPY;  Service: Endoscopy;  Laterality: N/A;  . LAPAROSCOPIC SIGMOID COLECTOMY N/A 09/16/2017   Procedure: LAPAROSCOPIC SIGMOID COLECTOMY;  Surgeon: Jules Husbands, MD;  Location: [facility] ORS;  Service: General;  Laterality: N/A;  Lithotomy position , both arms tucked    SOCIAL HISTORY: Former smoker quit smoking many years ago.  No alcohol abuse.  Lives with the family. Social History   Socioeconomic History  . Marital status: Married    Spouse name: Not on file  . Number of children: Not on file  . Years of education: Not  on file  . Highest education level: Not on file  Occupational History  . Not on file  Social Needs  . Financial resource strain: Not on file  . Food insecurity:    Worry:  Not on file    Inability: Not on file  . Transportation needs:    Medical: Not on file    Non-medical: Not on file  Tobacco Use  . Smoking status: Former Smoker    Types: Cigarettes  . Smokeless tobacco: Never Used  . Tobacco comment: as a youngster  Substance and Sexual Activity  . Alcohol use: No    Alcohol/week: 0.0 oz  . Drug use: No  . Sexual activity: Not on file  Lifestyle  . Physical activity:    Days per week: Not on file    Minutes per session: Not on file  . Stress: Not on file  Relationships  . Social connections:    Talks on phone: Not on file    Gets together: Not on file    Attends religious service: Not on file    Active member of club or organization: Not on file    Attends meetings of clubs or organizations: Not on file    Relationship status: Not on file  . Intimate partner violence:    Fear of current or ex partner: Not on file    Emotionally abused: Not on file    Physically abused: Not on file    Forced sexual activity: Not on file  Other Topics Concern  . Not on file  Social History Narrative  . Not on file    FAMILY HISTORY: Family History  Problem Relation Age of Onset  . CAD Father   . Breast cancer Maternal Aunt 40    ALLERGIES:  is allergic to benadryl [diphenhydramine]; codeine; vicodin [hydrocodone-acetaminophen]; penicillins; phenergan [promethazine hcl]; and sulfur.  MEDICATIONS:  Current Outpatient Medications  Medication Sig Dispense Refill  . amLODipine (NORVASC) 10 MG tablet Take 10 mg by mouth daily.     No current facility-administered medications for this visit.       Marland Kitchen  PHYSICAL EXAMINATION: ECOG PERFORMANCE STATUS: 1 - Symptomatic but completely ambulatory  Vitals:   12/03/17 1500  BP: 131/82  Pulse: 91  Resp: 18  Temp: 97.6 F (36.4 C)   Filed Weights   12/03/17 1535  Weight: 169 lb (76.7 kg)    Physical Exam  Constitutional: She is oriented to person, place, and time and well-developed,  well-nourished, and in no distress.  HENT:  Head: Normocephalic and atraumatic.  Mouth/Throat: Oropharynx is clear and moist. No oropharyngeal exudate.  Eyes: Pupils are equal, round, and reactive to light.  Neck: Normal range of motion. Neck supple.  Cardiovascular: Normal rate and regular rhythm.  Pulmonary/Chest: No respiratory distress. She has no wheezes.  Abdominal: Soft. Bowel sounds are normal. She exhibits no distension and no mass. There is no tenderness. There is no rebound and no guarding.  Musculoskeletal: Normal range of motion. She exhibits no edema or tenderness.  Neurological: She is alert and oriented to person, place, and time.  Skin: Skin is warm.  Psychiatric: Affect normal.     LABORATORY DATA:  I have reviewed the data as listed Lab Results  Component Value Date   WBC 6.8 12/03/2017   HGB 14.2 12/03/2017   HCT 41.3 12/03/2017   MCV 81.0 12/03/2017   PLT 242 12/03/2017   Recent Labs    09/09/17 1432 09/16/17 0751 09/16/17 1521 09/17/17 0454  12/03/17 1603  NA 139 140  --  135 139  K 3.3* 3.6  --  3.6 3.9  CL 104  --   --  104 105  CO2 26  --   --  26 26  GLUCOSE 97 109*  --  150* 123*  BUN 12  --   --  7 15  CREATININE 0.69  --  0.66 0.77 0.67  CALCIUM 9.0  --   --  8.4* 9.1  GFRNONAA >60  --  >60 >60 >60  GFRAA >60  --  >60 >60 >60  PROT 8.3*  --   --   --  7.8  ALBUMIN 4.8  --   --   --  4.4  AST 28  --   --   --  22  ALT 28  --   --   --  19  ALKPHOS 85  --   --   --  85  BILITOT 1.5*  --   --   --  1.0    RADIOGRAPHIC STUDIES: I have personally reviewed the radiological images as listed and agreed with the findings in the report. No results found.  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  Clinically, nonfunctional-however ongoing cough [etiology is unclear-question functional carcinoid].   #Long discussion regarding the natural history  of carcinoid; and in general the slow-growing nature of the disease.  However in general surgery is recommended for cure.  Patient is reluctant with any further therapeutic options.  #Cough-chronic unclear etiology; question functional carcinoid versus others discussed the importance of gallium PET scan for further evaluation however patient concerned about radiation; wants to hold off for now.  Await urine 5-HIAA  #Patient agrees to blood work; CBC CMP chromogranin A; urine 5-HIAA.  Follow-up in 4 weeks.   Thank you Dr. Genevive Bi for allowing me to participate in the care of your pleasant patient. Please do not hesitate to contact me with questions or concerns in the interim.  Patient also introduced to lung navigator.  # I reviewed the blood work- with the patient in detail; also reviewed the imaging independently [as summarized above]; and with the patient in detail.   Cc; Dr.Oaks/Sparks.    All questions were answered. The patient knows to call the clinic with any problems, questions or concerns.  # 60 minutes face-to-face with the patient discussing the above plan of care; more than 50% of time spent on prognosis/ natural history; counseling and coordination.     Cammie Sickle, MD 12/04/2017 10:22 PM

## 2017-12-05 DIAGNOSIS — C7A090 Malignant carcinoid tumor of the bronchus and lung: Secondary | ICD-10-CM | POA: Diagnosis not present

## 2017-12-06 ENCOUNTER — Other Ambulatory Visit: Payer: Self-pay

## 2017-12-06 DIAGNOSIS — C7A090 Malignant carcinoid tumor of the bronchus and lung: Secondary | ICD-10-CM

## 2017-12-06 LAB — CHROMOGRANIN A: Chromogranin A: <1 nmol/L (ref 0–5)

## 2017-12-06 NOTE — Progress Notes (Signed)
  Oncology Nurse Navigator Documentation  Navigator Location: [facility]-Med Onc (12/03/17 1600) Referral date to RadOnc/MedOnc: 11/05/17 (12/03/17 1600) )Navigator Encounter Type: Initial MedOnc (12/03/17 1600)   Abnormal Finding Date: 07/26/17 (12/03/17 1600) Confirmed Diagnosis Date: 10/25/17 (12/03/17 1600)                   Barriers/Navigation Needs: Coordination of Care;Education (12/03/17 1600) Education: Newly Diagnosed Cancer Education (12/03/17 1600) Interventions: Coordination of Care (12/03/17 1600)   Coordination of Care: Appts (12/03/17 1600)        Acuity: Level 1 (12/03/17 1600) Acuity Level 1: Initial guidance, education and coordination as needed (12/03/17 1600)    met with patient during initial med-onc consultation with Dr. Rogue Bussing. All recommendations discussed with patient and questions answered at the time of visit. Reviewed upcoming appts with patient. Contact info given and instructed to call with any further questions or needs. Pt verbalized understanding.   Time Spent with Patient: 60 (12/03/17 1600)

## 2017-12-09 LAB — 5 HIAA, QUANTITATIVE, URINE, 24 HOUR
5 HIAA UR: 4.4 mg/L
5-HIAA,QUANT.,24 HR URINE: 4 mg/(24.h) (ref 0.0–14.9)
Total Volume: 900

## 2017-12-23 ENCOUNTER — Telehealth: Payer: Self-pay | Admitting: *Deleted

## 2017-12-23 NOTE — Telephone Encounter (Signed)
Patient came to clinic to speak to nurse. She continues to c/o of a cough and yellow mucus production. She would like to know what interventions Dr. B would recommend. She would also like to know if Dr. B would consider changing her norvasc. She has "done a lot of reading and research and believes that norvasc would make the carcinoid grow. This article came from [facility]." She also declined to see Dr. Tasia Catchings. She only wanted to see Dr. B and would rather move her apt on 7/22 to another day.  She also inquired about her lab results.  I explained to her that lab work up at the last visit was wnl. Her apt on 7/22 was changed to 7/25 at 9am. I recommended otc delsym to help with her upper respiratory symptoms. She stated that she was hesitant to try anything without talking Dr. B and requested that Dr. B recommend an antibiotic. She has no fevers or signs of infection. The sinus congestion is a chronic issue. I explained to her that Dr. Jacinto Reap is not likely to treat her with antibiotic. I would give him the msg. She states that She would talk to him about her concerns at her apt. I also advised her to be careful of the research that she receives on the Internet. I am not aware of any clinical data that reflects a correlation between norvasc and carcinoid, but will be glad to inquire about such research with the doctor.  She states that her norvasc is regulating her bp well without any concerns. She was requesting to start on verapamil as she states that this drug was "better for carcinoid patients." I asked her to discuss her blood pressure medications with pcp-Dr. Doy Hutching.  I spent approximately 25 mins with the patient providing active listening.

## 2017-12-27 ENCOUNTER — Inpatient Hospital Stay: Payer: BLUE CROSS/BLUE SHIELD | Admitting: Oncology

## 2017-12-30 ENCOUNTER — Inpatient Hospital Stay: Payer: BLUE CROSS/BLUE SHIELD | Attending: Internal Medicine | Admitting: Internal Medicine

## 2017-12-30 ENCOUNTER — Encounter: Payer: Self-pay | Admitting: Internal Medicine

## 2017-12-30 ENCOUNTER — Other Ambulatory Visit: Payer: Self-pay

## 2017-12-30 VITALS — BP 140/85 | HR 76 | Temp 98.2°F | Resp 20 | Ht 62.0 in | Wt 175.0 lb

## 2017-12-30 DIAGNOSIS — C7A090 Malignant carcinoid tumor of the bronchus and lung: Secondary | ICD-10-CM | POA: Diagnosis present

## 2017-12-30 DIAGNOSIS — R21 Rash and other nonspecific skin eruption: Secondary | ICD-10-CM | POA: Diagnosis not present

## 2017-12-30 DIAGNOSIS — R05 Cough: Secondary | ICD-10-CM | POA: Insufficient documentation

## 2017-12-30 NOTE — Progress Notes (Signed)
[place] OFFICE PROGRESS NOTE  Patient Care Team: Idelle Crouch, MD as PCP - General (Internal Medicine) Telford Nab, RN as Registered Nurse  Cancer Staging No matching staging information was found for the patient.   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  female pleasant patient above history of newly diagnosed lung carcinoid of the right side is currently for follow-up.  Patient continues to have intermittent skin rash chronic; chronic cough dry-for many years.  She denies any new shortness of breath or chest pain.  Denies any diarrhea.  Review of Systems  Constitutional: Negative for chills, diaphoresis, fever, malaise/fatigue and weight loss.  HENT: Negative for nosebleeds and sore throat.   Eyes: Negative for double vision.  Respiratory: Positive for cough. Negative for hemoptysis, sputum production, shortness of breath and wheezing.   Cardiovascular: Negative for chest pain, palpitations, orthopnea and leg swelling.  Gastrointestinal: Negative for abdominal pain, blood in stool, constipation, diarrhea, heartburn, melena, nausea and vomiting.  Genitourinary: Negative for dysuria, frequency and urgency.  Musculoskeletal: Negative for back pain and joint pain.  Skin: Positive for rash. Negative for itching.  Neurological: Negative for dizziness, tingling, focal weakness, weakness and headaches.  Endo/Heme/Allergies: Does not bruise/bleed easily.  Psychiatric/Behavioral: Negative for depression. The patient is not nervous/anxious and does not have insomnia.       PAST MEDICAL HISTORY :  Past Medical History:  Diagnosis Date  . Complication of anesthesia     severe nausea after carpal tunnel that lasted almost 2 weeks. difficult to wake up  . Cysts of both ovaries   . Hypertension   . Mass of colon 08/2017  . Stroke ([place]) 2017  . TIA (transient ischemic attack) 2017   balance problems. no residual now  . Tingling of right upper extremity 2019    PAST SURGICAL HISTORY :   Past Surgical History:  Procedure Laterality Date  . carpel tunnel Right   . COLONOSCOPY WITH PROPOFOL N/A 08/27/2017   Procedure: COLONOSCOPY WITH PROPOFOL;  Surgeon: Lollie Sails, MD;  Location: [facility] ENDOSCOPY;  Service: Endoscopy;  Laterality: N/A;  . ESOPHAGOGASTRODUODENOSCOPY (EGD) WITH PROPOFOL N/A 08/27/2017   Procedure: ESOPHAGOGASTRODUODENOSCOPY (EGD) WITH PROPOFOL;  Surgeon: Lollie Sails, MD;  Location: [facility] ENDOSCOPY;  Service: Endoscopy;  Laterality: N/A;  . LAPAROSCOPIC SIGMOID COLECTOMY N/A 09/16/2017   Procedure: LAPAROSCOPIC SIGMOID COLECTOMY;  Surgeon: Jules Husbands, MD;  Location: [facility] ORS;  Service: General;  Laterality: N/A;  Lithotomy position , both arms tucked    FAMILY HISTORY :   Family History  Problem Relation Age of Onset  . CAD Father   . Breast cancer Maternal Aunt 40    SOCIAL HISTORY:   Social History   Tobacco Use  . Smoking status: Former Smoker    Types: Cigarettes  . Smokeless tobacco: Never Used  . Tobacco comment: as a youngster  Substance Use Topics  . Alcohol use: No    Alcohol/week: 0.0 oz  . Drug use: No    ALLERGIES:  is allergic to benadryl [diphenhydramine]; codeine; vicodin [hydrocodone-acetaminophen]; penicillins; phenergan [promethazine hcl]; and sulfur.  MEDICATIONS:  Current Outpatient Medications  Medication Sig Dispense Refill  . amLODipine (NORVASC) 10 MG tablet  Take 10 mg by mouth daily.     No current facility-administered medications for this visit.     PHYSICAL EXAMINATION: ECOG PERFORMANCE STATUS: 0 - Asymptomatic  BP 140/85 (Patient Position: Sitting)   Pulse 76   Temp 98.2 F  (36.8 C) (Tympanic)   Resp 20   Ht 5\' 2"  (1.575 m)   Wt 175 lb (79.4 kg)   BMI 32.01 kg/m   Filed Weights   12/30/17 0926  Weight: 175 lb (79.4 kg)    GENERAL: Well-nourished well-developed; Alert, no distress and comfortable.  Accompanied by family.  EYES: no pallor or icterus OROPHARYNX: no thrush or ulceration; NECK: supple; no lymph nodes felt. LYMPH:  no palpable lymphadenopathy in the axillary or inguinal regions LUNGS: Decreased breath sounds auscultation bilaterally. No wheeze or crackles HEART/CVS: regular rate & rhythm and no murmurs; No lower extremity edema ABDOMEN:abdomen soft, non-tender and normal bowel sounds. No hepatomegaly or splenomegaly.  Musculoskeletal:no cyanosis of digits and no clubbing  PSYCH: alert & oriented x 3 with fluent speech NEURO: no focal motor/sensory deficits SKIN:  no rashes or significant lesions    LABORATORY DATA:  I have reviewed the data as listed    Component Value Date/Time   NA 139 12/03/2017 1603   NA 141 06/01/2014 1325   K 3.9 12/03/2017 1603   K 3.9 06/01/2014 1325   CL 105 12/03/2017 1603   CL 107 06/01/2014 1325   CO2 26 12/03/2017 1603   CO2 26 06/01/2014 1325   GLUCOSE 123 (H) 12/03/2017 1603   GLUCOSE 89 06/01/2014 1325   BUN 15 12/03/2017 1603   BUN 12 06/01/2014 1325   CREATININE 0.67 12/03/2017 1603   CREATININE 0.86 06/01/2014 1325   CALCIUM 9.1 12/03/2017 1603   CALCIUM 8.3 (L) 06/01/2014 1325   PROT 7.8 12/03/2017 1603   PROT 7.4 06/01/2014 1325   ALBUMIN 4.4 12/03/2017 1603   ALBUMIN 3.6 06/01/2014 1325   AST 22 12/03/2017 1603   AST 33 06/01/2014 1325   ALT 19 12/03/2017 1603   ALT 31 06/01/2014 1325   ALKPHOS 85 12/03/2017 1603   ALKPHOS 87 06/01/2014 1325   BILITOT 1.0 12/03/2017 1603   BILITOT 0.8 06/01/2014 1325   GFRNONAA >60 12/03/2017 1603   GFRNONAA >60 06/01/2014 1325   GFRAA >60 12/03/2017 1603   GFRAA >60 06/01/2014 1325    No results found for: SPEP, UPEP  Lab Results   Component Value Date   WBC 6.8 12/03/2017   NEUTROABS 4.3 12/03/2017   HGB 14.2 12/03/2017   HCT 41.3 12/03/2017   MCV 81.0 12/03/2017   PLT 242 12/03/2017      Chemistry      Component Value Date/Time   NA 139 12/03/2017 1603   NA 141 06/01/2014 1325   K 3.9 12/03/2017 1603   K 3.9 06/01/2014 1325   CL 105 12/03/2017 1603   CL 107 06/01/2014 1325   CO2 26 12/03/2017 1603   CO2 26 06/01/2014 1325   BUN 15 12/03/2017 1603   BUN 12 06/01/2014 1325   CREATININE 0.67 12/03/2017 1603   CREATININE 0.86 06/01/2014 1325      Component Value Date/Time   CALCIUM 9.1 12/03/2017 1603   CALCIUM 8.3 (L) 06/01/2014 1325   ALKPHOS 85 12/03/2017 1603   ALKPHOS 87 06/01/2014 1325   AST 22 12/03/2017 1603   AST 33 06/01/2014 1325   ALT 19 12/03/2017 1603   ALT 31 06/01/2014 1325   BILITOT 1.0 12/03/2017 1603  BILITOT 0.8 06/01/2014 1325       RADIOGRAPHIC STUDIES: I have personally reviewed the radiological images as listed and agreed with the findings in the report. No results found.   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  Nonfunctional-24-hour urine 5-HIAA negative.   #Long discussion with patient and family regarding the natural history of carcinoid-that it is a malignancy although slow-growing.  In general would recommend resection; and absence of dissection would recommend surveillance.  Patient concerned about finances exposure to radiation/regarding PET scan.   #Recommend surveillance imaging in approximately 6 months from now/February 2020.   # Chronic cough-chronic unclear etiology; clinically not related to carcinoid defer to PCP  #Mild skin rash -  clinically not related to carcinoid chronic- defer to PCP/dermatology.  # follow up first week of March 2020.  Cc; Dr.Oaks/[place].    Orders Placed This Encounter  Procedures  . CT CHEST WO  CONTRAST    Standing Status:   Future    Standing Expiration Date:   12/31/2018    Order Specific Question:   Preferred imaging location?    Answer:   [facility]    Order Specific Question:   Radiology Contrast Protocol - do NOT remove file path    Answer:   [email or web address]    Order Specific Question:   ** REASON FOR EXAM (FREE TEXT)    Answer:   lung carcinoid  . Chromogranin A    Standing Status:   Future    Standing Expiration Date:   12/30/2018   All questions were answered. The patient knows to call the clinic with any problems, questions or concerns.      Cammie Sickle, MD 12/30/2017 1:48 PM

## 2017-12-30 NOTE — Assessment & Plan Note (Addendum)
#  Carcinoid Right lung middle/lower lobe lung-patient reluctant with surgical approach.  Nonfunctional-24-hour urine 5-HIAA negative.   #Long discussion with patient and family regarding the natural history of carcinoid-that it is a malignancy although slow-growing.  In general would recommend resection; and absence of dissection would recommend surveillance.  Patient concerned about finances exposure to radiation/regarding PET scan.   #Recommend surveillance imaging in approximately 6 months from now/February 2020.   # Chronic cough-chronic unclear etiology; clinically not related to carcinoid defer to PCP  #Mild skin rash -  clinically not related to carcinoid chronic- defer to PCP/dermatology.  # follow up first week of March 2020.  Cc; Dr.Oaks/Sparks.

## 2018-01-22 ENCOUNTER — Encounter: Payer: Self-pay | Admitting: Emergency Medicine

## 2018-01-22 ENCOUNTER — Emergency Department
Admission: EM | Admit: 2018-01-22 | Discharge: 2018-01-22 | Disposition: A | Discharge status: Home / Self Care | Payer: BLUE CROSS/BLUE SHIELD | Attending: Emergency Medicine | Admitting: Emergency Medicine

## 2018-01-22 DIAGNOSIS — I10 Essential (primary) hypertension: Secondary | ICD-10-CM | POA: Diagnosis not present

## 2018-01-22 DIAGNOSIS — Z8673 Personal history of transient ischemic attack (TIA), and cerebral infarction without residual deficits: Secondary | ICD-10-CM | POA: Diagnosis not present

## 2018-01-22 DIAGNOSIS — C7A090 Malignant carcinoid tumor of the bronchus and lung: Secondary | ICD-10-CM | POA: Diagnosis not present

## 2018-01-22 DIAGNOSIS — K635 Polyp of colon: Secondary | ICD-10-CM | POA: Diagnosis not present

## 2018-01-22 DIAGNOSIS — R739 Hyperglycemia, unspecified: Secondary | ICD-10-CM | POA: Insufficient documentation

## 2018-01-22 DIAGNOSIS — Z79899 Other long term (current) drug therapy: Secondary | ICD-10-CM | POA: Diagnosis not present

## 2018-01-22 DIAGNOSIS — R2241 Localized swelling, mass and lump, right lower limb: Secondary | ICD-10-CM | POA: Insufficient documentation

## 2018-01-22 DIAGNOSIS — L03116 Cellulitis of left lower limb: Secondary | ICD-10-CM | POA: Diagnosis not present

## 2018-01-22 DIAGNOSIS — Z87891 Personal history of nicotine dependence: Secondary | ICD-10-CM | POA: Diagnosis not present

## 2018-01-22 DIAGNOSIS — R2242 Localized swelling, mass and lump, left lower limb: Secondary | ICD-10-CM | POA: Diagnosis not present

## 2018-01-22 DIAGNOSIS — L539 Erythematous condition, unspecified: Secondary | ICD-10-CM | POA: Diagnosis present

## 2018-01-22 LAB — CBC WITH DIFFERENTIAL/PLATELET
BASOS ABS: 0.1 10*3/uL (ref 0–0.1)
Basophils Relative: 1 %
EOS PCT: 3 %
Eosinophils Absolute: 0.2 10*3/uL (ref 0–0.7)
HEMATOCRIT: 41.1 % (ref 35.0–47.0)
HEMOGLOBIN: 14.4 g/dL (ref 12.0–16.0)
LYMPHS ABS: 1.6 10*3/uL (ref 1.0–3.6)
Lymphocytes Relative: 23 %
MCH: 28.2 pg (ref 26.0–34.0)
MCHC: 35 g/dL (ref 32.0–36.0)
MCV: 80.5 fL (ref 80.0–100.0)
Monocytes Absolute: 0.5 10*3/uL (ref 0.2–0.9)
Monocytes Relative: 6 %
NEUTROS ABS: 4.7 10*3/uL (ref 1.4–6.5)
Neutrophils Relative %: 67 %
Platelets: 246 10*3/uL (ref 150–440)
RBC: 5.11 MIL/uL (ref 3.80–5.20)
RDW: 15.2 % — ABNORMAL HIGH (ref 11.5–14.5)
WBC: 7.1 10*3/uL (ref 3.6–11.0)

## 2018-01-22 LAB — COMPREHENSIVE METABOLIC PANEL
ALT: 24 U/L (ref 0–44)
ANION GAP: 9 (ref 5–15)
AST: 31 U/L (ref 15–41)
Albumin: 4.4 g/dL (ref 3.5–5.0)
Alkaline Phosphatase: 81 U/L (ref 38–126)
BILIRUBIN TOTAL: 1.2 mg/dL (ref 0.3–1.2)
BUN: 13 mg/dL (ref 8–23)
CHLORIDE: 105 mmol/L (ref 98–111)
CO2: 24 mmol/L (ref 22–32)
Calcium: 9.2 mg/dL (ref 8.9–10.3)
Creatinine, Ser: 0.64 mg/dL (ref 0.44–1.00)
GFR calc Af Amer: >60 mL/min (ref >60)
GFR calc non Af Amer: >60 mL/min (ref >60)
Glucose, Bld: 154 mg/dL — ABNORMAL HIGH (ref 70–99)
POTASSIUM: 3.5 mmol/L (ref 3.5–5.1)
Sodium: 138 mmol/L (ref 135–145)
TOTAL PROTEIN: 7.5 g/dL (ref 6.5–8.1)

## 2018-01-22 LAB — BRAIN NATRIURETIC PEPTIDE: B Natriuretic Peptide: 19 pg/mL (ref 0.0–100.0)

## 2018-01-22 LAB — LACTIC ACID, PLASMA: Lactic Acid, Venous: 1.2 mmol/L (ref 0.5–1.9)

## 2018-01-22 LAB — APTT: aPTT: 27 seconds (ref 24–36)

## 2018-01-22 LAB — PROTIME-INR
INR: 0.91
PROTHROMBIN TIME: 12.2 s (ref 11.4–15.2)

## 2018-01-22 MED ORDER — CLINDAMYCIN HCL 150 MG PO CAPS
300.0000 mg | ORAL_CAPSULE | Freq: Once | ORAL | Status: AC
  Administered 2018-01-22: 300 mg via ORAL
  Filled 2018-01-22: qty 2

## 2018-01-22 MED ORDER — CLINDAMYCIN HCL 300 MG PO CAPS: 300.0000 mg | ORAL_CAPSULE | Freq: Four times a day (QID) | ORAL | 0 refills | Status: AC

## 2018-01-22 NOTE — ED Triage Notes (Signed)
Patient presents to the ED with insect bite to left leg 9 days ago.  Patient states area is draining and red and patient is concerned because redness does not seem to be improving.  Patient is in no obvious distress at this time.

## 2018-01-22 NOTE — ED Provider Notes (Signed)
[facility] Emergency Department Provider Note  ____________________________________________  Time seen: Approximately 5:50 PM  I have reviewed the triage vital signs and the nursing notes.   HISTORY  Chief Complaint Insect Bite    HPI Lindsay Schwartz is a 62 y.o. female who presents the emergency department concern for spreading infection to the left lower extremity.  Patient reports that she may have been bit by something to her left leg approximately 10 days ago.  She has had erythema, mild tenderness, drainage from a site on her left lower extremity.  This is overlying her lateral tibial region.  Patient reports that adjacent to this area she has had increasing redness, swelling, and now reports a "red streak" descending down her leg.  Patient reports that her ankles are mildly puffy at baseline, however her left ankle appears to be more edematous than her right.  She also areas to her lower extremity with nontraumatic bruising.  She reports that this occurs occasionally but she has noted more within the past 10 days.  Patient denies any fevers or chills, chest pain, shortness of breath, nausea vomiting.  No medication for this complaint prior to arrival.  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  Currently patient is not undergoing chemotherapy, radiation.  She has not had this surgically resected.  Patient has a history of hypertension, CVA, TIA, hyperglycemia.  No complaints with chronic baseline issues.    Past Medical History:  Diagnosis Date  . Complication of anesthesia    severe nausea after carpal tunnel that lasted almost 2 weeks. difficult to wake up  . Cysts of both ovaries   . Hypertension   . Mass of colon 08/2017  . Stroke ([place]) 2017  . TIA (transient ischemic attack) 2017   balance problems. no residual now  . Tingling of right upper extremity 2019    Patient Active Problem List   Diagnosis Date  Noted  . 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 S/P laparoscopic colectomy 09/16/2017  . Polyp of sigmoid colon   . HTN, goal below 140/80 07/07/2017  . Hyperglycemia, unspecified 07/07/2017  . History of CVA (cerebrovascular accident) 12/20/2015  . Mixed hyperlipidemia 12/20/2015  . Prehypertension 12/20/2015  . CVA (cerebral infarction) 10/27/2015  . Abnormal EKG 10/09/2015  . Elevated systolic blood pressure reading without diagnosis of hypertension 10/09/2015    Past Surgical History:  Procedure Laterality Date  . carpel tunnel Right   . COLONOSCOPY WITH PROPOFOL N/A 08/27/2017   Procedure: COLONOSCOPY WITH PROPOFOL;  Surgeon: Lollie Sails, MD;  Location: [facility] ENDOSCOPY;  Service: Endoscopy;  Laterality: N/A;  . ESOPHAGOGASTRODUODENOSCOPY (EGD) WITH PROPOFOL N/A 08/27/2017   Procedure: ESOPHAGOGASTRODUODENOSCOPY (EGD) WITH PROPOFOL;  Surgeon: Lollie Sails, MD;  Location: [facility] ENDOSCOPY;  Service: Endoscopy;  Laterality: N/A;  . LAPAROSCOPIC SIGMOID COLECTOMY N/A 09/16/2017   Procedure: LAPAROSCOPIC SIGMOID COLECTOMY;  Surgeon: Jules Husbands, MD;  Location: [facility] ORS;  Service: General;  Laterality: N/A;  Lithotomy position , both arms tucked    Prior to Admission medications   Medication Sig Start Date End Date Taking? Authorizing Provider  amLODipine (NORVASC) 10 MG tablet Take 10 mg by mouth daily.    [provider]  clindamycin (CLEOCIN) 300 MG capsule Take 1 capsule (300 mg total) by mouth 4 (four) times daily. 01/22/18   Cuthriell, Charline Bills, PA-C    Allergies Benadryl [diphenhydramine]; Codeine; Vicodin [hydrocodone-acetaminophen]; Penicillins; Phenergan [promethazine hcl];  and Sulfur  Family History  Problem Relation Age of Onset  . CAD Father   . Breast cancer Maternal Aunt 21    Social History Social History   Tobacco Use  . Smoking status: Former Smoker    Types: Cigarettes  . Smokeless tobacco: Never Used  .  Tobacco comment: as a youngster  Substance Use Topics  . Alcohol use: No    Alcohol/week: 0.0 standard drinks  . Drug use: No     Review of Systems  Constitutional: No fever/chills Eyes: No visual changes. No discharge ENT: No upper respiratory complaints. Cardiovascular: no chest pain.  Bilateral lower extremity edema, at baseline, worse on left than right which is new.  Patient reports a few nontraumatic bruises to the lower extremity, more than normal. Respiratory: no cough. No SOB. Gastrointestinal: No abdominal pain.  No nausea, no vomiting.  No diarrhea.  No constipation. Genitourinary: Negative for dysuria. No hematuria. Musculoskeletal: Negative for musculoskeletal pain. Skin: Positive for erythematous, painful lesion to the left lower extremity.  Positive for "pink" will streak down her left lower extremity. Neurological: Negative for headaches, focal weakness or numbness. 10-point ROS otherwise negative.  ____________________________________________   PHYSICAL EXAM:  VITAL SIGNS: ED Triage Vitals  Enc Vitals Group     BP 01/22/18 1733 (!) 166/93     Pulse Rate 01/22/18 1733 91     Resp 01/22/18 1733 18     Temp 01/22/18 1733 98.5 F (36.9 C)     Temp Source 01/22/18 1733 Oral     SpO2 01/22/18 1733 97 %     Weight 01/22/18 1731 180 lb (81.6 kg)     Height 01/22/18 1731 5\' 2"  (1.575 m)     Head Circumference --      Peak Flow --      Pain Score 01/22/18 1731 0     Pain Loc --      Pain Edu? --      Excl. in [place]? --      Constitutional: Alert and oriented. Well appearing and in no acute distress. Eyes: Conjunctivae are normal. PERRL. EOMI. Head: Atraumatic. Neck: No stridor.  Neck is supple full range of motion Hematological/Lymphatic/Immunilogical: No cervical lymphadenopathy. Cardiovascular: Normal rate, regular rhythm. Normal S1 and S2.  Good peripheral circulation.  Patient with mild bilateral lower extremity edema.  1+ edema to the right lower  extremity, 2+ edema to the left lower extremity.  No pitting.  A few scattered areas of ecchymosis lower extremities. Respiratory: Normal respiratory effort without tachypnea or retractions. Lungs CTAB. Good air entry to the bases with no decreased or absent breath sounds. Musculoskeletal: Full range of motion to all extremities. No gross deformities appreciated. Neurologic:  Normal speech and language. No gross focal neurologic deficits are appreciated.  Skin:  Skin is warm, dry and intact. No rash noted.  It with an erythematous and edematous lesion to the left lower leg.  This is just lateral to the midline over the tibial region.  Area is mildly tender to palpation.  No fluctuance or induration.  No drainage appreciated at this time.  Patient also has an erythematous lesion more medially.  This has a few scattered petechial lesions surrounding area.  Erythematous lesion looks more like tissue hemorrhage versus cellulitis.  Minimally erythematous streak running from skin lesion towards the ankle.  Dorsalis pedis pulse intact bilateral lower extremities.  Sensation intact and equal bilateral lower extremities. Psychiatric: Mood and affect are normal. Speech and behavior are  normal. Patient exhibits appropriate insight and judgement.   ____________________________________________   LABS (all labs ordered are listed, but only abnormal results are displayed)  Labs Reviewed  COMPREHENSIVE METABOLIC PANEL - Abnormal; Notable for the following components:      Result Value   Glucose, Bld 154 (*)    All other components within normal limits  CBC WITH DIFFERENTIAL/PLATELET - Abnormal; Notable for the following components:   RDW 15.2 (*)    All other components within normal limits  BRAIN NATRIURETIC PEPTIDE  PROTIME-INR  APTT  LACTIC ACID, PLASMA  LACTIC ACID, PLASMA   ____________________________________________  EKG   ____________________________________________  RADIOLOGY   No  results found.  ____________________________________________    PROCEDURES  Procedure(s) performed:    Procedures    Medications  clindamycin (CLEOCIN) capsule 300 mg (has no administration in time range)     ____________________________________________   INITIAL IMPRESSION / ASSESSMENT AND PLAN / ED COURSE  Pertinent labs & imaging results that were available during my care of the patient were reviewed by me and considered in my medical decision making (see chart for details).  Review of the [place] CSRS was performed in accordance of the [place] prior to dispensing any controlled drugs.  Clinical Course as of Jan 23 1916  Sat Jan 22, 2018  [place] Patient presented to the emergency department with erythematous lesion to the left lower extremity.  Area looks like mild cellulitis with possible extending streak.  Patient did have a few areas of spontaneous ecchymosis in one area that appeared petechial-like.  Given patient's recent diagnosis of carcinoid of the lung, patient was evaluated with further lab work.  These returned with reassuring results with no indication of thrombocytopenia, significant infection, and organ damage.  Given reassuring exam, reassuring labs, patient will be treated outpatient with antibiotics and instructed to follow-up with primary care and oncology as needed.   [JC]    Clinical Course User Index [JC] Cuthriell, Charline Bills, PA-C     Patient's diagnosis is consistent with the lightest the left lower extremity.  Patient presents emergency department for increasing redness to the left lower extremity.  Basic labs returned with reassuring results.  No indication for imaging at this time.  Patient will be started on clindamycin for cellulitis.. Patient will be discharged home with prescriptions for clindamycin. Patient is to follow up with primary care as needed or otherwise directed. Patient is given ED precautions to return to the ED for any worsening or new  symptoms.     ____________________________________________  FINAL CLINICAL IMPRESSION(S) / ED DIAGNOSES  Final diagnoses:  Cellulitis of left lower extremity      NEW MEDICATIONS STARTED DURING THIS VISIT:  ED Discharge Orders         Ordered    clindamycin (CLEOCIN) 300 MG capsule  4 times daily     01/22/18 1914              This chart was dictated using voice recognition software/Dragon. Despite best efforts to proofread, errors can occur which can change the meaning. Any change was purely unintentional.    Darletta Moll, PA-C 01/22/18 1918    Orbie Pyo, MD 01/22/18 [phone]

## 2018-02-09 ENCOUNTER — Other Ambulatory Visit: Payer: Self-pay | Admitting: Internal Medicine

## 2018-02-09 DIAGNOSIS — Z8639 Personal history of other endocrine, nutritional and metabolic disease: Secondary | ICD-10-CM

## 2018-02-10 ENCOUNTER — Other Ambulatory Visit: Payer: Self-pay | Admitting: Internal Medicine

## 2018-02-10 DIAGNOSIS — Z8639 Personal history of other endocrine, nutritional and metabolic disease: Secondary | ICD-10-CM

## 2018-02-16 ENCOUNTER — Ambulatory Visit
Admission: RE | Admit: 2018-02-16 | Discharge: 2018-02-16 | Disposition: A | Discharge status: Home / Self Care | Payer: BLUE CROSS/BLUE SHIELD | Source: Ambulatory Visit | Attending: Internal Medicine | Admitting: Internal Medicine

## 2018-02-16 DIAGNOSIS — E340 Carcinoid syndrome: Secondary | ICD-10-CM | POA: Insufficient documentation

## 2018-02-16 DIAGNOSIS — Z8639 Personal history of other endocrine, nutritional and metabolic disease: Secondary | ICD-10-CM | POA: Insufficient documentation

## 2018-02-16 DIAGNOSIS — R918 Other nonspecific abnormal finding of lung field: Secondary | ICD-10-CM | POA: Insufficient documentation

## 2018-02-16 DIAGNOSIS — I700 Atherosclerosis of aorta: Secondary | ICD-10-CM | POA: Insufficient documentation

## 2018-07-09 DEATH — deceased

## 2018-07-25 ENCOUNTER — Other Ambulatory Visit: Payer: Self-pay | Admitting: Internal Medicine

## 2018-07-27 ENCOUNTER — Other Ambulatory Visit: Payer: Self-pay | Admitting: Internal Medicine

## 2018-07-27 DIAGNOSIS — C7A090 Malignant carcinoid tumor of the bronchus and lung: Secondary | ICD-10-CM

## 2018-07-31 NOTE — Progress Notes (Signed)
[name]  [phone]) [phone] Fax:(336) [phone]  ID: Lindsay Schwartz OB: October 01, 1955  MR#: [id number]  IWP#:[id number]  Patient Care Team: Idelle Crouch, MD as PCP - General (Internal Medicine) Telford Nab, RN as Registered Nurse  CHIEF COMPLAINT: Malignant carcinoid tumor of the lung.  INTERVAL HISTORY: Patient is a 63 year old female who is being evaluated for the first time after requesting a transfer from another physician.  She returns to clinic today for further evaluation and discussion of her imaging results.  She continues to be anxious, but otherwise feels well.  She has no neurologic complaints.  She denies any recent fevers or illnesses.  She has a good appetite and denies weight loss.  She has no chest pain, cough, hemoptysis, or shortness of breath.  She denies any nausea, vomiting, constipation, or diarrhea.  She has no urinary complaints.  Patient feels at her baseline offers no specific complaints today.  REVIEW OF SYSTEMS:   Review of Systems  Constitutional: Negative.  Negative for fever, malaise/fatigue and weight loss.  Respiratory: Negative.  Negative for cough, hemoptysis and shortness of breath.   Cardiovascular: Negative.  Negative for chest pain and leg swelling.  Gastrointestinal: Negative.  Negative for abdominal pain.  Genitourinary: Negative.  Negative for dysuria.  Musculoskeletal: Negative.  Negative for back pain.  Skin: Negative.  Negative for rash.  Neurological: Negative.  Negative for focal weakness, weakness and headaches.  Psychiatric/Behavioral: Negative.  The patient is not nervous/anxious.     As per HPI. Otherwise, a complete review of systems is negative.  PAST MEDICAL HISTORY: Past Medical History:  Diagnosis Date  . Complication of anesthesia    severe nausea after carpal tunnel that lasted almost 2 weeks. difficult to wake up  . Cysts of both ovaries   . Hypertension   . Mass of colon 08/2017  .  Stroke ([place]) 2017  . TIA (transient ischemic attack) 2017   balance problems. no residual now  . Tingling of right upper extremity 2019    PAST SURGICAL HISTORY: Past Surgical History:  Procedure Laterality Date  . carpel tunnel Right   . COLONOSCOPY WITH PROPOFOL N/A 08/27/2017   Procedure: COLONOSCOPY WITH PROPOFOL;  Surgeon: Lollie Sails, MD;  Location: [facility] ENDOSCOPY;  Service: Endoscopy;  Laterality: N/A;  . ESOPHAGOGASTRODUODENOSCOPY (EGD) WITH PROPOFOL N/A 08/27/2017   Procedure: ESOPHAGOGASTRODUODENOSCOPY (EGD) WITH PROPOFOL;  Surgeon: Lollie Sails, MD;  Location: [facility] ENDOSCOPY;  Service: Endoscopy;  Laterality: N/A;  . LAPAROSCOPIC SIGMOID COLECTOMY N/A 09/16/2017   Procedure: LAPAROSCOPIC SIGMOID COLECTOMY;  Surgeon: Jules Husbands, MD;  Location: [facility] ORS;  Service: General;  Laterality: N/A;  Lithotomy position , both arms tucked    FAMILY HISTORY: Family History  Problem Relation Age of Onset  . CAD Father   . Breast cancer Maternal Aunt 49    ADVANCED DIRECTIVES (Y/N):  N  HEALTH MAINTENANCE: Social History   Tobacco Use  . Smoking status: Former Smoker    Types: Cigarettes  . Smokeless tobacco: Never Used  . Tobacco comment: as a youngster  Substance Use Topics  . Alcohol use: No    Alcohol/week: 0.0 standard drinks  . Drug use: No     Colonoscopy:  PAP:  Bone density:  Lipid panel:  Allergies  Allergen Reactions  . Benadryl [Diphenhydramine] Other (See Comments)    Excessive sleepiness/unable to be awaken (took 4 days to wake up)  . Codeine Other (See Comments)    Causes mental disorientation--loopy, "brain  does not function"  . Vicodin [Hydrocodone-Acetaminophen] Other (See Comments)    Sees clouds in her head, gets sick and dizzy   . Penicillins Rash    Has patient had a PCN reaction causing immediate rash, facial/tongue/throat swelling, SOB or lightheadedness with hypotension: Yes Has patient had a PCN reaction causing severe rash  involving mucus membranes or skin necrosis: No Has patient had a PCN reaction that required hospitalization: No Has patient had a PCN reaction occurring within the last 10 years: No If all of the above answers are "NO", then may proceed with Cephalosporin use.  Marland Kitchen Phenergan [Promethazine Hcl] Other (See Comments)    Excessive sleepiness/unable to be awaken   . Sulfur Other (See Comments)    Childhood reaction unknown    Current Outpatient Medications  Medication Sig Dispense Refill  . amLODipine (NORVASC) 10 MG tablet Take 10 mg by mouth daily.    . clindamycin (CLEOCIN) 300 MG capsule Take 1 capsule (300 mg total) by mouth 4 (four) times daily. 28 capsule 0  . furosemide (LASIX) 20 MG tablet Take 1 tablet by mouth 1 day or 1 dose.     No current facility-administered medications for this visit.     OBJECTIVE: Vitals:   08/04/18 1043  BP: (!) 139/93  Pulse: (!) 103  Temp: 98.5 F (36.9 C)     Body mass index is 31.72 kg/m.    ECOG FS:0 - Asymptomatic  General: Well-developed, well-nourished, no acute distress. Eyes: Pink conjunctiva, anicteric sclera. HEENT: Normocephalic, moist mucous membranes, clear oropharnyx. Lungs: Clear to auscultation bilaterally. Heart: Regular rate and rhythm. No rubs, murmurs, or gallops. Abdomen: Soft, nontender, nondistended. No organomegaly noted, normoactive bowel sounds. Musculoskeletal: No edema, cyanosis, or clubbing. Neuro: Alert, answering all questions appropriately. Cranial nerves grossly intact. Skin: No rashes or petechiae noted. Psych: Normal affect. Lymphatics: No cervical, calvicular, axillary or inguinal LAD.   LAB RESULTS:  Lab Results  Component Value Date   NA 138 01/22/2018   K 3.5 01/22/2018   CL 105 01/22/2018   CO2 24 01/22/2018   GLUCOSE 154 (H) 01/22/2018   BUN 13 01/22/2018   CREATININE 0.64 01/22/2018   CALCIUM 9.2 01/22/2018   PROT 7.5 01/22/2018   ALBUMIN 4.4 01/22/2018   AST 31 01/22/2018   ALT 24  01/22/2018   ALKPHOS 81 01/22/2018   BILITOT 1.2 01/22/2018   GFRNONAA >60 01/22/2018   GFRAA >60 01/22/2018    Lab Results  Component Value Date   WBC 7.1 01/22/2018   NEUTROABS 4.7 01/22/2018   HGB 14.4 01/22/2018   HCT 41.1 01/22/2018   MCV 80.5 01/22/2018   PLT 246 01/22/2018     STUDIES: Ct Chest Wo Contrast  Result Date: 08/02/2018 CLINICAL DATA:  Follow-up malignant carcinoid tumor of bronchus and lung EXAM: CT CHEST WITHOUT CONTRAST TECHNIQUE: Multidetector CT imaging of the chest was performed following the standard protocol without IV contrast. COMPARISON:  02/16/2018 FINDINGS: Cardiovascular: Heart is normal in size.  No pericardial effusion. No evidence of thoracic aortic aneurysm. Mild atherosclerotic calcifications of the aortic arch. Mediastinum/Nodes: No suspicious mediastinal lymphadenopathy. 10 mm short axis low right paratracheal node with preservation of the normal fatty hilum (series 2/image 60). Visualized thyroid is unremarkable. Lungs/Pleura: Mild biapical pleural-parenchymal scarring. Mild lingular scarring/atelectasis. Right lung nodules, mildly progressive, as follows: --15 x 11 mm right middle lobe nodule (series 3/image 89), previously 13 x 10 mm --8 x 8 mm anterior right lower lobe nodule (series 3/image 95), previously 7  x 8 mm --5 mm lateral right lower lobe nodule (series 3/image 69), previously 4 mm No focal consolidation. No pleural effusion or pneumothorax. Upper Abdomen: Visualized upper abdomen is grossly unremarkable. Musculoskeletal: Visualized osseous structures are within normal limits. IMPRESSION: Mildly progressive nodules in the right middle and lower lobes, measuring up to 15 mm, as above. Aortic Atherosclerosis (ICD10-I70.0). Electronically Signed   By: Julian Hy M.D.   On: 08/02/2018 11:30    ASSESSMENT: Malignant carcinoid tumor of the lung.  PLAN:   1. Malignant carcinoid tumor of the lung: Patient underwent CT-guided biopsy on Oct 21, 2017 at [facility] confirming the diagnosis.  Pathology consistent with a low-grade malignant carcinoid tumor.  It was recommended patient undergo surgical resection and she had consultation with thoracic surgery, which she ultimately declined any intervention.  Patient continues to decline any interventions or treatments.  CT scan results from August 02, 2018 reviewed independently and report as above with a mildly progressive nodule in the right middle lobe measuring 15 mm (previously 13 mm).  Patient has 2 other nodules in the right lower lobe measuring 5 mm and 8 mm that are essentially unchanged.  No intervention is needed at this time.  Return to clinic in 6 months with repeat imaging and further evaluation.  I spent a total of 30 minutes face-to-face with the patient of which greater than 50% of the visit was spent in counseling and coordination of care as detailed above.   Patient expressed understanding and was in agreement with this plan. She also understands that She can call clinic at any time with any questions, concerns, or complaints.   Cancer Staging No matching staging information was found for the patient.  Lloyd Huger, MD   08/05/2018 10:58 PM

## 2018-08-02 ENCOUNTER — Ambulatory Visit
Admission: RE | Admit: 2018-08-02 | Discharge: 2018-08-02 | Disposition: A | Discharge status: Home / Self Care | Payer: BLUE CROSS/BLUE SHIELD | Source: Ambulatory Visit | Attending: Internal Medicine | Admitting: Internal Medicine

## 2018-08-02 ENCOUNTER — Inpatient Hospital Stay: Payer: BLUE CROSS/BLUE SHIELD | Attending: Oncology

## 2018-08-02 DIAGNOSIS — C7A090 Malignant carcinoid tumor of the bronchus and lung: Secondary | ICD-10-CM | POA: Insufficient documentation

## 2018-08-04 ENCOUNTER — Inpatient Hospital Stay (HOSPITAL_BASED_OUTPATIENT_CLINIC_OR_DEPARTMENT_OTHER): Payer: BLUE CROSS/BLUE SHIELD | Admitting: Oncology

## 2018-08-04 ENCOUNTER — Other Ambulatory Visit: Payer: Self-pay

## 2018-08-04 VITALS — BP 139/93 | HR 103 | Temp 98.5°F | Wt 173.4 lb

## 2018-08-04 DIAGNOSIS — C7A090 Malignant carcinoid tumor of the bronchus and lung: Secondary | ICD-10-CM | POA: Diagnosis not present

## 2018-08-04 NOTE — Progress Notes (Signed)
Patient is here today to follow up malignant carcinoid tumor of bronchus and lung. Patient stated that she had been doing well with no complaints.

## 2018-08-05 ENCOUNTER — Ambulatory Visit: Payer: BLUE CROSS/BLUE SHIELD | Admitting: Internal Medicine

## 2018-08-05 LAB — CHROMOGRANIN A REBASELINE
Chromogranin A (ng/mL): 31.9 ng/mL (ref 0.0–101.8)
Chromogranin A: 1 nmol/L (ref 0–5)

## 2019-02-01 ENCOUNTER — Other Ambulatory Visit: Payer: Self-pay

## 2019-02-01 DIAGNOSIS — C7A090 Malignant carcinoid tumor of the bronchus and lung: Secondary | ICD-10-CM

## 2019-02-03 ENCOUNTER — Inpatient Hospital Stay: Payer: BLUE CROSS/BLUE SHIELD | Attending: Oncology

## 2019-02-03 ENCOUNTER — Ambulatory Visit: Payer: BLUE CROSS/BLUE SHIELD

## 2019-02-10 NOTE — Progress Notes (Deleted)
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  INTERVAL HISTORY: Patient is a 63 year old female who is being evaluated for the first time after requesting a transfer from another physician.  She returns to clinic today for further evaluation and discussion of her imaging results.  She continues to be anxious, but otherwise feels well.  She has no neurologic complaints.  She denies any recent fevers or illnesses.  She has a good appetite and denies weight loss.  She has no chest pain, cough, hemoptysis, or shortness of breath.  She denies any nausea, vomiting, constipation, or diarrhea.  She has no urinary complaints.  Patient feels at her baseline offers no specific complaints today.  REVIEW OF SYSTEMS:   Review of Systems  Constitutional: Negative.  Negative for fever, malaise/fatigue and weight loss.  Respiratory: Negative.  Negative for cough, hemoptysis and shortness of breath.   Cardiovascular: Negative.  Negative for chest pain and leg swelling.  Gastrointestinal: Negative.  Negative for abdominal pain.  Genitourinary: Negative.  Negative for dysuria.  Musculoskeletal: Negative.  Negative for back pain.  Skin: Negative.  Negative for rash.  Neurological: Negative.  Negative for focal weakness, weakness and headaches.  Psychiatric/Behavioral: Negative.  The patient is not nervous/anxious.     As per HPI. Otherwise, a complete review of systems is negative.  PAST MEDICAL HISTORY: Past Medical History:  Diagnosis Date  . Complication of anesthesia    severe nausea after carpal tunnel that lasted almost 2 weeks. difficult to wake up  . Cysts of both ovaries   . Hypertension   . Mass of colon 08/2017  .  Stroke ([place]) 2017  . TIA (transient ischemic attack) 2017   balance problems. no residual now  . Tingling of right upper extremity 2019    PAST SURGICAL HISTORY: Past Surgical History:  Procedure Laterality Date  . carpel tunnel Right   . COLONOSCOPY WITH PROPOFOL N/A 08/27/2017   Procedure: COLONOSCOPY WITH PROPOFOL;  Surgeon: Lollie Sails, MD;  Location: [facility] ENDOSCOPY;  Service: Endoscopy;  Laterality: N/A;  . ESOPHAGOGASTRODUODENOSCOPY (EGD) WITH PROPOFOL N/A 08/27/2017   Procedure: ESOPHAGOGASTRODUODENOSCOPY (EGD) WITH PROPOFOL;  Surgeon: Lollie Sails, MD;  Location: [facility] ENDOSCOPY;  Service: Endoscopy;  Laterality: N/A;  . LAPAROSCOPIC SIGMOID COLECTOMY N/A 09/16/2017   Procedure: LAPAROSCOPIC SIGMOID COLECTOMY;  Surgeon: Jules Husbands, MD;  Location: [facility] ORS;  Service: General;  Laterality: N/A;  Lithotomy position , both arms tucked    FAMILY HISTORY: Family History  Problem Relation Age of Onset  . CAD Father   . Breast cancer Maternal Aunt 15    ADVANCED DIRECTIVES (Y/N):  N  HEALTH MAINTENANCE: Social History   Tobacco Use  . Smoking status: Former Smoker    Types: Cigarettes  . Smokeless tobacco: Never Used  . Tobacco comment: as a youngster  Substance Use Topics  . Alcohol use: No    Alcohol/week: 0.0 standard drinks  . Drug use: No     Colonoscopy:  PAP:  Bone density:  Lipid panel:  Allergies  Allergen Reactions  . Benadryl [Diphenhydramine] Other (See Comments)    Excessive sleepiness/unable to be awaken (took 4 days to wake up)  . Codeine Other (See Comments)    Causes mental disorientation--loopy, "brain  does not function"  . Vicodin [Hydrocodone-Acetaminophen] Other (See Comments)    Sees clouds in her head, gets sick and dizzy   . Penicillins Rash    Has patient had a PCN reaction causing immediate rash, facial/tongue/throat swelling, SOB or lightheadedness with hypotension: Yes Has patient had a PCN reaction causing severe rash  involving mucus membranes or skin necrosis: No Has patient had a PCN reaction that required hospitalization: No Has patient had a PCN reaction occurring within the last 10 years: No If all of the above answers are "NO", then may proceed with Cephalosporin use.  Marland Kitchen Phenergan [Promethazine Hcl] Other (See Comments)    Excessive sleepiness/unable to be awaken   . Sulfur Other (See Comments)    Childhood reaction unknown    Current Outpatient Medications  Medication Sig Dispense Refill  . amLODipine (NORVASC) 10 MG tablet Take 10 mg by mouth daily.    . clindamycin (CLEOCIN) 300 MG capsule Take 1 capsule (300 mg total) by mouth 4 (four) times daily. 28 capsule 0  . furosemide (LASIX) 20 MG tablet Take 1 tablet by mouth 1 day or 1 dose.     No current facility-administered medications for this visit.     OBJECTIVE: There were no vitals filed for this visit.   There is no height or weight on file to calculate BMI.    ECOG FS:0 - Asymptomatic  General: Well-developed, well-nourished, no acute distress. Eyes: Pink conjunctiva, anicteric sclera. HEENT: Normocephalic, moist mucous membranes, clear oropharnyx. Lungs: Clear to auscultation bilaterally. Heart: Regular rate and rhythm. No rubs, murmurs, or gallops. Abdomen: Soft, nontender, nondistended. No organomegaly noted, normoactive bowel sounds. Musculoskeletal: No edema, cyanosis, or clubbing. Neuro: Alert, answering all questions appropriately. Cranial nerves grossly intact. Skin: No rashes or petechiae noted. Psych: Normal affect. Lymphatics: No cervical, calvicular, axillary or inguinal LAD.   LAB RESULTS:  Lab Results  Component Value Date   NA 138 01/22/2018   K 3.5 01/22/2018   CL 105 01/22/2018   CO2 24 01/22/2018   GLUCOSE 154 (H) 01/22/2018   BUN 13 01/22/2018   CREATININE 0.64 01/22/2018   CALCIUM 9.2 01/22/2018   PROT 7.5 01/22/2018   ALBUMIN 4.4 01/22/2018   AST 31 01/22/2018   ALT 24 01/22/2018   ALKPHOS 81  01/22/2018   BILITOT 1.2 01/22/2018   GFRNONAA >60 01/22/2018   GFRAA >60 01/22/2018    Lab Results  Component Value Date   WBC 7.1 01/22/2018   NEUTROABS 4.7 01/22/2018   HGB 14.4 01/22/2018   HCT 41.1 01/22/2018   MCV 80.5 01/22/2018   PLT 246 01/22/2018     STUDIES: No results found.  ASSESSMENT: Malignant carcinoid tumor of the lung.  PLAN:   1. Malignant carcinoid tumor of the lung: Patient underwent CT-guided biopsy on Oct 21, 2017 at [facility] confirming the diagnosis.  Pathology consistent with a low-grade malignant carcinoid tumor.  It was recommended patient undergo surgical resection and she had consultation with thoracic surgery, which she ultimately declined any intervention.  Patient continues to decline any interventions or treatments.  CT scan results from August 02, 2018 reviewed independently and report as above with a mildly progressive nodule in the right middle lobe measuring 15 mm (previously 13 mm).  Patient has 2 other nodules in the right lower lobe measuring 5 mm and 8 mm that are essentially unchanged.  No intervention is needed at this time.  Return to clinic in 6 months with repeat imaging and further  evaluation.  I spent a total of 30 minutes face-to-face with the patient of which greater than 50% of the visit was spent in counseling and coordination of care as detailed above.   Patient expressed understanding and was in agreement with this plan. She also understands that She can call clinic at any time with any questions, concerns, or complaints.   Cancer Staging No matching staging information was found for the patient.  Lloyd Huger, MD   02/10/2019 4:23 PM

## 2019-02-14 ENCOUNTER — Ambulatory Visit: Payer: BLUE CROSS/BLUE SHIELD | Admitting: Oncology

## 2019-02-16 ENCOUNTER — Encounter: Payer: Self-pay | Admitting: Oncology

## 2019-02-16 ENCOUNTER — Inpatient Hospital Stay: Payer: BLUE CROSS/BLUE SHIELD | Admitting: Oncology

## 2019-06-01 ENCOUNTER — Other Ambulatory Visit: Payer: Self-pay | Admitting: Internal Medicine

## 2019-06-01 DIAGNOSIS — R911 Solitary pulmonary nodule: Secondary | ICD-10-CM

## 2019-06-01 DIAGNOSIS — J1282 Pneumonia due to coronavirus disease 2019: Secondary | ICD-10-CM

## 2019-06-15 ENCOUNTER — Ambulatory Visit: Payer: 59

## 2019-06-21 ENCOUNTER — Other Ambulatory Visit: Payer: Self-pay

## 2019-06-21 ENCOUNTER — Ambulatory Visit
Admission: RE | Admit: 2019-06-21 | Discharge: 2019-06-21 | Disposition: A | Discharge status: Home / Self Care | Payer: 59 | Source: Ambulatory Visit | Attending: Internal Medicine | Admitting: Internal Medicine

## 2019-06-21 DIAGNOSIS — J1282 Pneumonia due to coronavirus disease 2019: Secondary | ICD-10-CM | POA: Diagnosis present

## 2019-06-21 DIAGNOSIS — U071 COVID-19: Secondary | ICD-10-CM

## 2019-06-21 DIAGNOSIS — R911 Solitary pulmonary nodule: Secondary | ICD-10-CM | POA: Insufficient documentation

## 2019-08-20 IMAGING — CT CT CHEST W/O CM
2 of 4 series · 15 of 36 positions shown, 18 images · non-contrast
Comparison: 02/16/2018

CLINICAL DATA: Follow-up malignant carcinoid tumor of bronchus and
lung

EXAM:
CT CHEST WITHOUT CONTRAST
TECHNIQUE: Multidetector CT imaging of the chest was performed following the
standard protocol without IV contrast.

[Series 2: chest · axial · 0.67mm/px · z∈[-1309,-1023]mm · 12 of 169 slices shown, 15 images (1 of 2)]
[im 13/169  mediastinal]
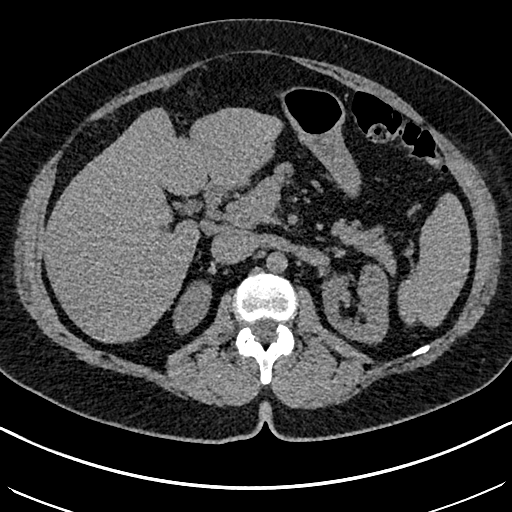
[im 13/169  lung]
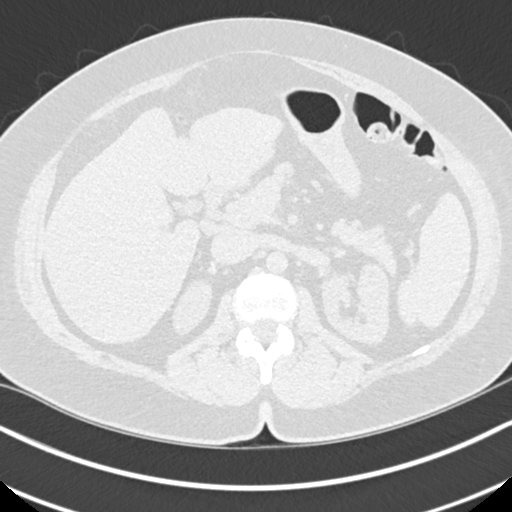
[im 26/169  lung]
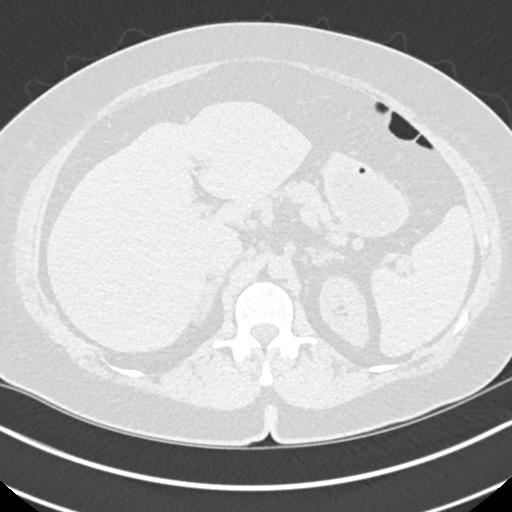
[im 39/169  lung]
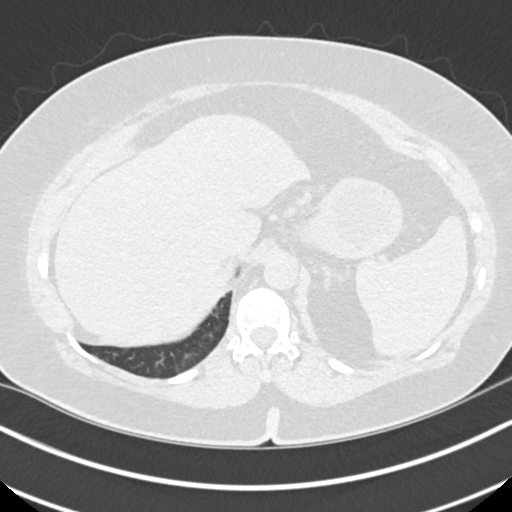
[im 52/169  lung]
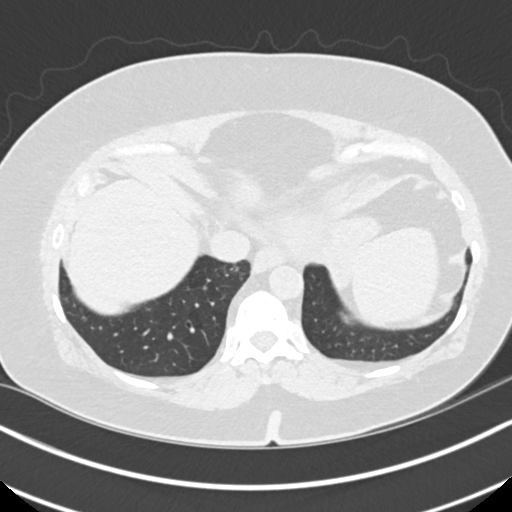
[im 65/169  mediastinal]
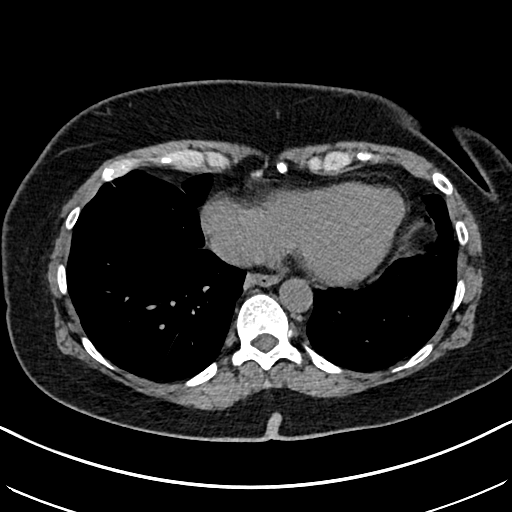
[im 65/169  lung]
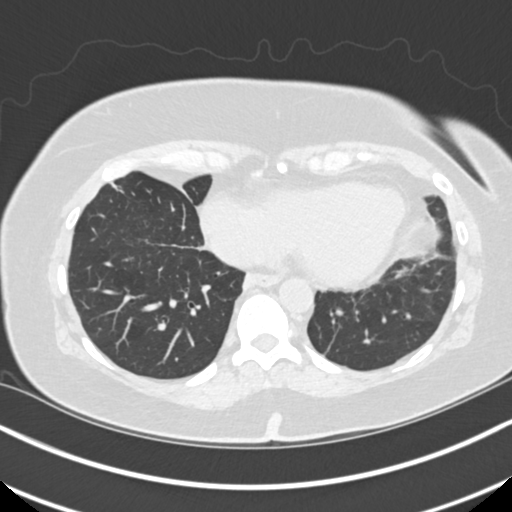
[im 78/169  lung]
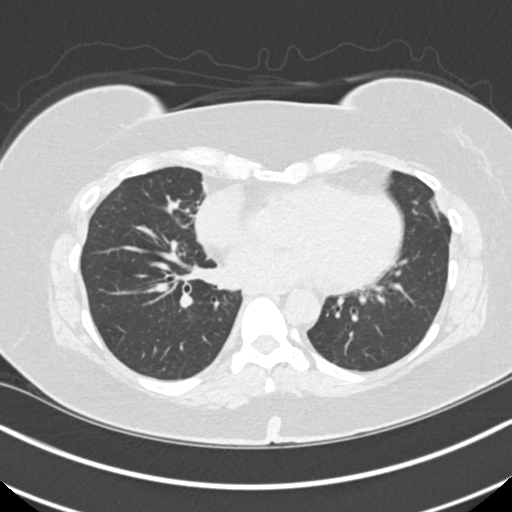
[im 91/169  lung]
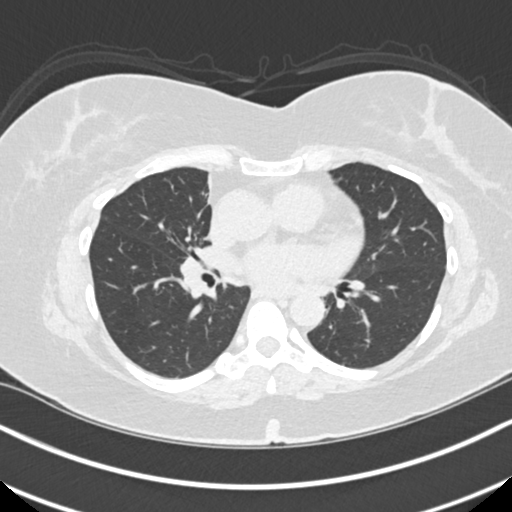
[im 104/169  lung]
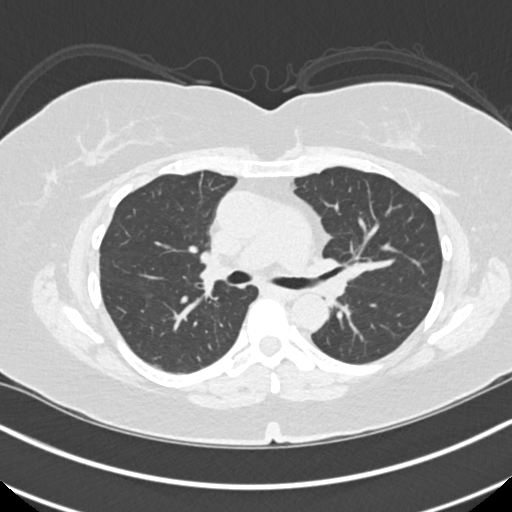
[im 117/169  mediastinal]
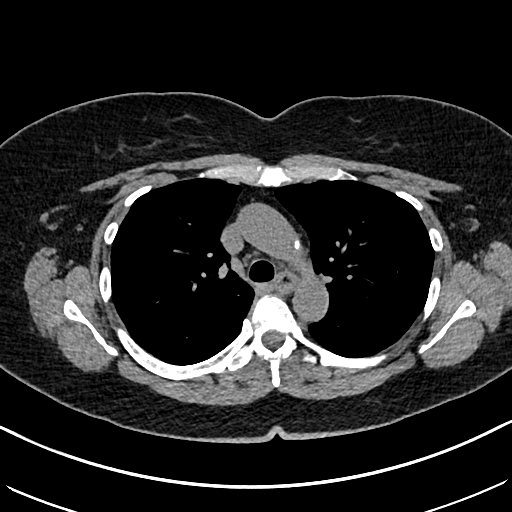
[im 117/169  lung]
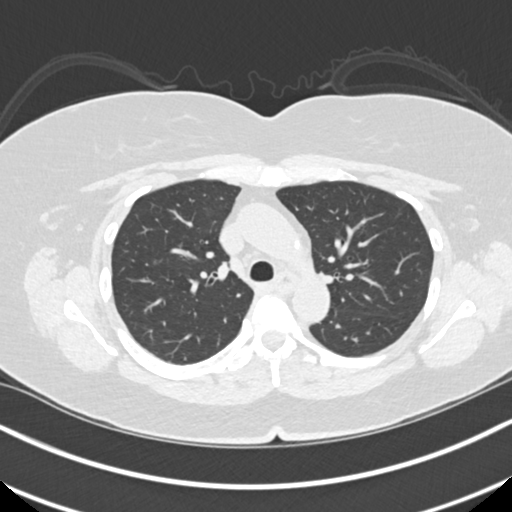
[im 130/169  lung]
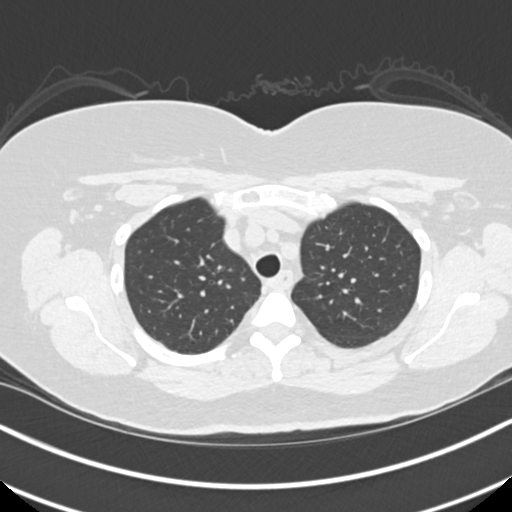
[im 143/169  lung]
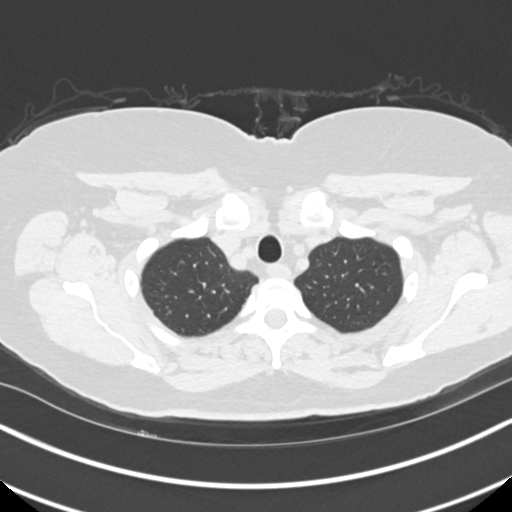
[im 156/169  lung]
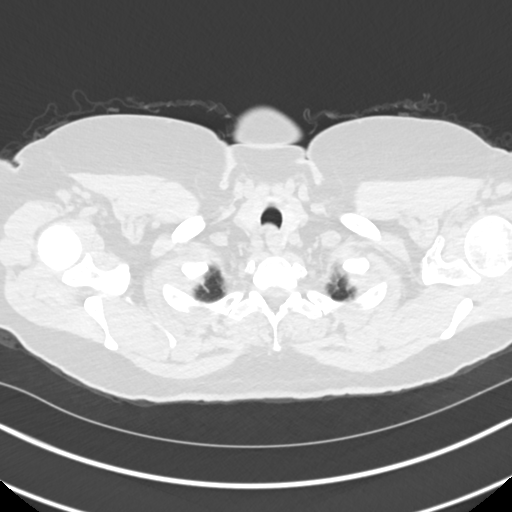

[Series 5: chest · coronal · 0.62mm/px · 3 of 148 slices shown (2 of 2)]
[im 30/148  lung]
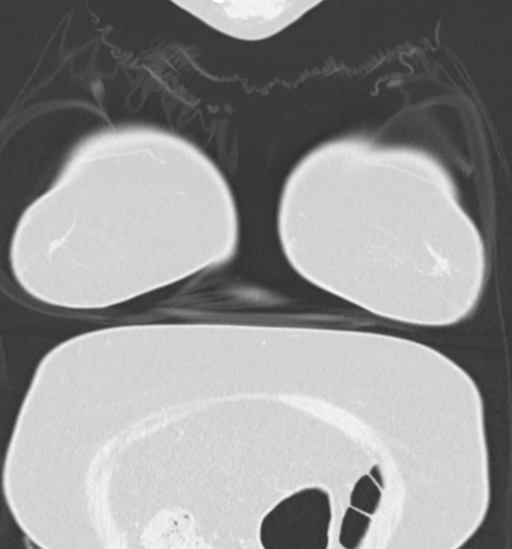
[im 59/148  lung]
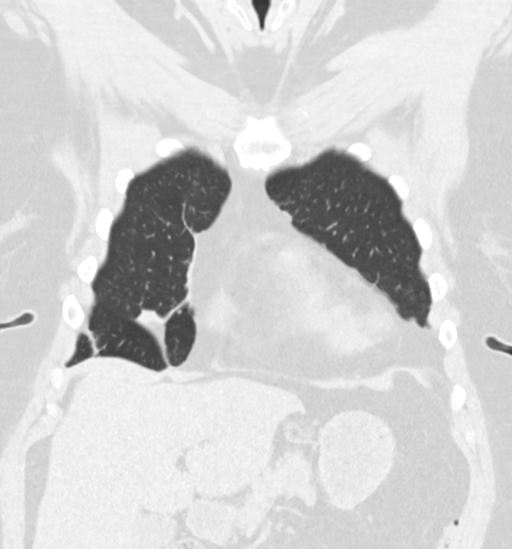
[im 89/148  lung]
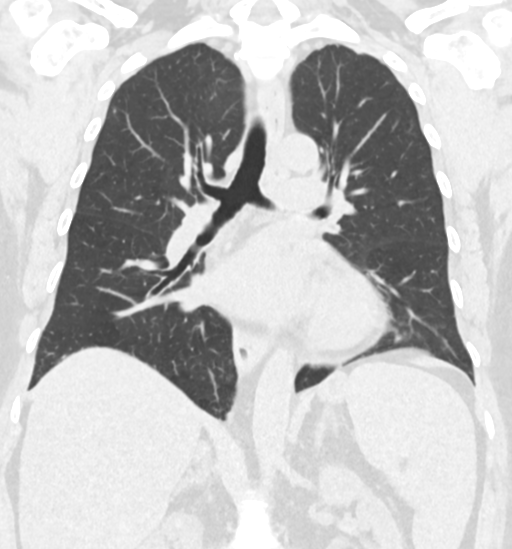

[15 of 36 positions shown; findings below may reference images not displayed]

FINDINGS: Cardiovascular: Heart is normal in size.  No pericardial effusion.

No evidence of thoracic aortic aneurysm. Mild atherosclerotic
calcifications of the aortic arch.

Mediastinum/Nodes: No suspicious mediastinal lymphadenopathy. 10 mm
short axis low right paratracheal node with preservation of the
normal fatty hilum (series 2/image 60).

Visualized thyroid is unremarkable.

Lungs/Pleura: Mild biapical pleural-parenchymal scarring.

Mild lingular scarring/atelectasis.

Right lung nodules, mildly progressive, as follows:

--15 x 11 mm right middle lobe nodule (series 3/image 89),
previously 13 x 10 mm

--8 x 8 mm anterior right lower lobe nodule (series 3/image 95),
previously 7 x 8 mm

--5 mm lateral right lower lobe nodule (series 3/image 69),
previously 4 mm

No focal consolidation.

No pleural effusion or pneumothorax.

Upper Abdomen: Visualized upper abdomen is grossly unremarkable.

Musculoskeletal: Visualized osseous structures are within normal
limits.
IMPRESSION: Mildly progressive nodules in the right middle and lower lobes,
measuring up to 15 mm, as above.

Aortic Atherosclerosis ([id number]).

## 2021-01-03 ENCOUNTER — Other Ambulatory Visit: Payer: Self-pay | Admitting: Internal Medicine

## 2021-01-03 DIAGNOSIS — Z1231 Encounter for screening mammogram for malignant neoplasm of breast: Secondary | ICD-10-CM

## 2021-03-21 ENCOUNTER — Emergency Department (HOSPITAL_COMMUNITY)
Admission: EM | Admit: 2021-03-21 | Discharge: 2021-03-22 | Disposition: A | Discharge status: Other Acute Inpatient Hospital | Payer: 59 | Attending: Emergency Medicine | Admitting: Emergency Medicine

## 2021-03-21 ENCOUNTER — Emergency Department (HOSPITAL_COMMUNITY): Payer: 59

## 2021-03-21 DIAGNOSIS — Z79899 Other long term (current) drug therapy: Secondary | ICD-10-CM | POA: Insufficient documentation

## 2021-03-21 DIAGNOSIS — Z85118 Personal history of other malignant neoplasm of bronchus and lung: Secondary | ICD-10-CM | POA: Insufficient documentation

## 2021-03-21 DIAGNOSIS — Z046 Encounter for general psychiatric examination, requested by authority: Secondary | ICD-10-CM | POA: Insufficient documentation

## 2021-03-21 DIAGNOSIS — Z87891 Personal history of nicotine dependence: Secondary | ICD-10-CM | POA: Insufficient documentation

## 2021-03-21 DIAGNOSIS — Y900 Blood alcohol level of less than 20 mg/100 ml: Secondary | ICD-10-CM | POA: Diagnosis not present

## 2021-03-21 DIAGNOSIS — Z20822 Contact with and (suspected) exposure to covid-19: Secondary | ICD-10-CM | POA: Diagnosis not present

## 2021-03-21 DIAGNOSIS — I10 Essential (primary) hypertension: Secondary | ICD-10-CM | POA: Diagnosis not present

## 2021-03-21 DIAGNOSIS — R4182 Altered mental status, unspecified: Secondary | ICD-10-CM | POA: Insufficient documentation

## 2021-03-21 DIAGNOSIS — F23 Brief psychotic disorder: Secondary | ICD-10-CM

## 2021-03-21 LAB — COMPREHENSIVE METABOLIC PANEL
ALT: 17 U/L (ref 0–44)
AST: 19 U/L (ref 15–41)
Albumin: 4.5 g/dL (ref 3.5–5.0)
Alkaline Phosphatase: 60 U/L (ref 38–126)
Anion gap: 8 (ref 5–15)
BUN: 10 mg/dL (ref 8–23)
CO2: 28 mmol/L (ref 22–32)
Calcium: 9 mg/dL (ref 8.9–10.3)
Chloride: 100 mmol/L (ref 98–111)
Creatinine, Ser: 0.71 mg/dL (ref 0.44–1.00)
GFR, Estimated: >60 mL/min (ref >60)
Glucose, Bld: 176 mg/dL — ABNORMAL HIGH (ref 70–99)
Potassium: 3.8 mmol/L (ref 3.5–5.1)
Sodium: 136 mmol/L (ref 135–145)
Total Bilirubin: 1.6 mg/dL — ABNORMAL HIGH (ref 0.3–1.2)
Total Protein: 7.6 g/dL (ref 6.5–8.1)

## 2021-03-21 LAB — URINALYSIS, ROUTINE W REFLEX MICROSCOPIC
Bacteria, UA: NONE SEEN
Bilirubin Urine: NEGATIVE
Glucose, UA: NEGATIVE mg/dL
Hgb urine dipstick: NEGATIVE
Ketones, ur: 5 mg/dL — AB
Nitrite: NEGATIVE
Protein, ur: NEGATIVE mg/dL
Specific Gravity, Urine: 1.016 (ref 1.005–1.030)
pH: 7 (ref 5.0–8.0)

## 2021-03-21 LAB — RESP PANEL BY RT-PCR (FLU A&B, COVID) ARPGX2
Influenza A by PCR: NEGATIVE
Influenza B by PCR: NEGATIVE
SARS Coronavirus 2 by RT PCR: NEGATIVE

## 2021-03-21 LAB — CBC WITH DIFFERENTIAL/PLATELET
Abs Immature Granulocytes: 0.02 10*3/uL (ref 0.00–0.07)
Basophils Absolute: 0 10*3/uL (ref 0.0–0.1)
Basophils Relative: 1 %
Eosinophils Absolute: 0.1 10*3/uL (ref 0.0–0.5)
Eosinophils Relative: 1 %
HCT: 47.3 % — ABNORMAL HIGH (ref 36.0–46.0)
Hemoglobin: 16.1 g/dL — ABNORMAL HIGH (ref 12.0–15.0)
Immature Granulocytes: 0 %
Lymphocytes Relative: 16 %
Lymphs Abs: 1.2 10*3/uL (ref 0.7–4.0)
MCH: 29 pg (ref 26.0–34.0)
MCHC: 34 g/dL (ref 30.0–36.0)
MCV: 85.2 fL (ref 80.0–100.0)
Monocytes Absolute: 0.4 10*3/uL (ref 0.1–1.0)
Monocytes Relative: 6 %
Neutro Abs: 6 10*3/uL (ref 1.7–7.7)
Neutrophils Relative %: 76 %
Platelets: 236 10*3/uL (ref 150–400)
RBC: 5.55 MIL/uL — ABNORMAL HIGH (ref 3.87–5.11)
RDW: 13.7 % (ref 11.5–15.5)
WBC: 7.8 10*3/uL (ref 4.0–10.5)
nRBC: 0 % (ref 0.0–0.2)

## 2021-03-21 LAB — RAPID URINE DRUG SCREEN, HOSP PERFORMED
Amphetamines: NOT DETECTED
Barbiturates: NOT DETECTED
Benzodiazepines: NOT DETECTED
Cocaine: NOT DETECTED
Opiates: NOT DETECTED
Tetrahydrocannabinol: NOT DETECTED

## 2021-03-21 LAB — ACETAMINOPHEN LEVEL: Acetaminophen (Tylenol), Serum: <10 ug/mL — ABNORMAL LOW (ref 10–30)

## 2021-03-21 LAB — SALICYLATE LEVEL: Salicylate Lvl: <7 mg/dL — ABNORMAL LOW (ref 7.0–30.0)

## 2021-03-21 LAB — CBG MONITORING, ED: Glucose-Capillary: 186 mg/dL — ABNORMAL HIGH (ref 70–99)

## 2021-03-21 LAB — ETHANOL: Alcohol, Ethyl (B): <10 mg/dL (ref <10)

## 2021-03-21 LAB — TSH: TSH: 1.336 u[IU]/mL (ref 0.350–4.500)

## 2021-03-21 MED ORDER — AMLODIPINE BESYLATE 5 MG PO TABS
5.0000 mg | ORAL_TABLET | Freq: Every day | ORAL | Status: DC
  Administered 2021-03-22: 5 mg via ORAL
  Filled 2021-03-21 (×2): qty 1

## 2021-03-21 MED ORDER — IBUPROFEN 200 MG PO TABS: 600.0000 mg | ORAL_TABLET | Freq: Three times a day (TID) | ORAL | Status: DC | PRN

## 2021-03-21 NOTE — ED Triage Notes (Signed)
Patient  to [facility]  by sheriff. Pt IVCd by family (see IVC). Pt cooperative. Scared no thinking clearly.

## 2021-03-21 NOTE — ED Provider Notes (Signed)
[place] DEPT Provider Note   CSN: [id number] Arrival date & time: 03/21/21  1438     History Chief Complaint  Patient presents with   Psychiatric Evaluation    Ziyan Hillmer is a 65 y.o. female with history of hypertension, CVA, TIA.  Presents to the emergency department under IVC by family member.  IVC paperwork reports that today patient stated that demons are trying to kill her, people watching her, and people are listening to her.  Patient is alert and oriented to person, place, and time.  Patient denies any physical complaints or injuries.  Patient reports that she is "going through spiritual warfare."  She states that "demons are attacking me and the devil is going to kill me."  Patient reports that this will "started with my mom getting sick, I have been going through this since then."  Reports that her mother was sick with COVID-19 in July 2022.  Reports increased stress due to mother's physical condition and care.  Patient denies any suicidal ideations, homicidal ideations, auditory hallucination, or visual hallucinations.  Denies any illicit drug use or alcohol use.  HPI     Past Medical History:  Diagnosis Date   Complication of anesthesia    severe nausea after carpal tunnel that lasted almost 2 weeks. difficult to wake up   Cysts of both ovaries    Hypertension    Mass of colon 08/2017   Stroke [facility]) 2017   TIA (transient ischemic attack) 2017   balance problems. no residual now   Tingling of right upper extremity 2019    Patient Active Problem List   Diagnosis Date Noted   Malignant carcinoid tumor of bronchus and lung ([place]) 12/03/2017   S/P laparoscopic colectomy 09/16/2017   Polyp of sigmoid colon    HTN, goal below 140/80 07/07/2017   Hyperglycemia, unspecified 07/07/2017   History of CVA (cerebrovascular accident) 12/20/2015   Mixed hyperlipidemia 12/20/2015   Prehypertension 12/20/2015   CVA (cerebral infarction)  10/27/2015   Abnormal EKG 10/09/2015   Elevated systolic blood pressure reading without diagnosis of hypertension 10/09/2015    Past Surgical History:  Procedure Laterality Date   carpel tunnel Right    COLONOSCOPY WITH PROPOFOL N/A 08/27/2017   Procedure: COLONOSCOPY WITH PROPOFOL;  Surgeon: Lollie Sails, MD;  Location: [facility] ENDOSCOPY;  Service: Endoscopy;  Laterality: N/A;   ESOPHAGOGASTRODUODENOSCOPY (EGD) WITH PROPOFOL N/A 08/27/2017   Procedure: ESOPHAGOGASTRODUODENOSCOPY (EGD) WITH PROPOFOL;  Surgeon: Lollie Sails, MD;  Location: [facility] ENDOSCOPY;  Service: Endoscopy;  Laterality: N/A;   LAPAROSCOPIC SIGMOID COLECTOMY N/A 09/16/2017   Procedure: LAPAROSCOPIC SIGMOID COLECTOMY;  Surgeon: Jules Husbands, MD;  Location: [facility] ORS;  Service: General;  Laterality: N/A;  Lithotomy position , both arms tucked     OB History   No obstetric history on file.     Family History  Problem Relation Age of Onset   CAD Father    Breast cancer Maternal Aunt 89    Social History   Tobacco Use   Smoking status: Former    Types: Cigarettes   Smokeless tobacco: Never   Tobacco comments:    as a youngster  Scientific laboratory technician Use: Never used  Substance Use Topics   Alcohol use: No    Alcohol/week: 0.0 standard drinks   Drug use: No    Home Medications Prior to Admission medications   Medication Sig Start Date End Date Taking? Authorizing Provider  amLODipine (NORVASC) 10 MG  tablet Take 10 mg by mouth daily.    [provider]  clindamycin (CLEOCIN) 300 MG capsule Take 1 capsule (300 mg total) by mouth 4 (four) times daily. 01/22/18   Cuthriell, Charline Bills, PA-C  furosemide (LASIX) 20 MG tablet Take 1 tablet by mouth 1 day or 1 dose. 06/24/18 06/24/19  [provider]    Allergies    Benadryl [diphenhydramine], Codeine, Vicodin [hydrocodone-acetaminophen], Elemental sulfur, Penicillins, and Phenergan [promethazine hcl]  Review of Systems   Review of Systems   Constitutional:  Negative for chills and fever.  Eyes:  Negative for visual disturbance.  Respiratory:  Negative for shortness of breath.   Cardiovascular:  Negative for chest pain.  Gastrointestinal:  Negative for abdominal pain, nausea and vomiting.  Genitourinary:  Negative for difficulty urinating and dysuria.  Musculoskeletal:  Negative for back pain and neck pain.  Skin:  Negative for color change and rash.  Neurological:  Negative for dizziness, syncope, light-headedness and headaches.  Psychiatric/Behavioral:  Negative for confusion, hallucinations, self-injury, sleep disturbance and suicidal ideas.    Physical Exam Updated Vital Signs BP (!) 154/89 (BP Location: Right Arm)   Pulse 79   Temp (!) 97.5 F (36.4 C)   Resp 17   SpO2 100%   Physical Exam Vitals and nursing note reviewed.  Constitutional:      General: She is not in acute distress.    Appearance: She is not ill-appearing, toxic-appearing or diaphoretic.  HENT:     Head: Normocephalic.  Eyes:     General: No scleral icterus.       Right eye: No discharge.        Left eye: No discharge.     Conjunctiva/sclera: Conjunctivae normal.  Cardiovascular:     Rate and Rhythm: Normal rate.     Pulses:          Radial pulses are 2+ on the right side and 2+ on the left side.  Pulmonary:     Effort: Pulmonary effort is normal. No tachypnea, bradypnea or respiratory distress.     Breath sounds: Normal breath sounds. No stridor.  Abdominal:     General: There is no distension. There are no signs of injury.     Palpations: Abdomen is soft.     Tenderness: There is no abdominal tenderness. There is no guarding or rebound.  Skin:    General: Skin is warm and dry.  Neurological:     General: No focal deficit present.     Mental Status: She is alert and oriented to person, place, and time.     GCS: GCS eye subscore is 4. GCS verbal subscore is 5. GCS motor subscore is 6.     Comments: No dysarthria or facial  asymmetry.  Patient moves all limbs equally without difficulty.  Psychiatric:        Attention and Perception: She is attentive. She does not perceive auditory or visual hallucinations.        Mood and Affect: Affect is flat.        Behavior: Behavior is cooperative.        Thought Content: Thought content does not include homicidal or suicidal ideation. Thought content does not include homicidal or suicidal plan.    ED Results / Procedures / Treatments   Labs (all labs ordered are listed, but only abnormal results are displayed) Labs Reviewed  COMPREHENSIVE METABOLIC PANEL - Abnormal; Notable for the following components:      Result Value  Glucose, Bld 176 (*)    Total Bilirubin 1.6 (*)    All other components within normal limits  CBC WITH DIFFERENTIAL/PLATELET - Abnormal; Notable for the following components:   RBC 5.55 (*)    Hemoglobin 16.1 (*)    HCT 47.3 (*)    All other components within normal limits  SALICYLATE LEVEL - Abnormal; Notable for the following components:   Salicylate Lvl <6.9 (*)    All other components within normal limits  ACETAMINOPHEN LEVEL - Abnormal; Notable for the following components:   Acetaminophen (Tylenol), Serum <10 (*)    All other components within normal limits  URINALYSIS, ROUTINE W REFLEX MICROSCOPIC - Abnormal; Notable for the following components:   APPearance HAZY (*)    Ketones, ur 5 (*)    Leukocytes,Ua LARGE (*)    All other components within normal limits  CBG MONITORING, ED - Abnormal; Notable for the following components:   Glucose-Capillary 186 (*)    All other components within normal limits  RESP PANEL BY RT-PCR (FLU A&B, COVID) ARPGX2  ETHANOL  RAPID URINE DRUG SCREEN, HOSP PERFORMED  TSH    EKG None  Radiology CT HEAD WO CONTRAST (5MM)  Result Date: 03/21/2021 CLINICAL DATA:  Altered mental status EXAM: CT HEAD WITHOUT CONTRAST TECHNIQUE: Contiguous axial images were obtained from the base of the skull through  the vertex without intravenous contrast. COMPARISON:  10/27/2015 FINDINGS: Brain: No evidence of acute infarction, hemorrhage, hydrocephalus, extra-axial collection or mass lesion/mass effect. Vascular: No hyperdense vessel or unexpected calcification. Skull: Normal. Negative for fracture or focal lesion. Sinuses/Orbits: No acute finding. Other: None. IMPRESSION: No acute intracranial abnormality noted. Electronically Signed   By: Inez Catalina M.D.   On: 03/21/2021 19:27    Procedures Procedures   Medications Ordered in ED Medications  ibuprofen (ADVIL) tablet 600 mg (has no administration in time range)  amLODipine (NORVASC) tablet 5 mg (has no administration in time range)    ED Course  I have reviewed the triage vital signs and the nursing notes.  Pertinent labs & imaging results that were available during my care of the patient were reviewed by me and considered in my medical decision making (see chart for details).    MDM Rules/Calculators/A&P                           Alert 65 year old female no distress, nontoxic-appearing.  Presents to emergency department with chief complaint of IVC and psychiatric evaluation.  Per chart review patient has no history of psychiatric diagnoses.  Patient is reporting that demons and the devil are trying to kill her.  Denies any SI, HI, AVH.  Denies any illicit drug use or alcohol use.  Patient is alert and oriented x3.  Medical clearance labs ordered.  Additionally will order noncontrasted CT, TSH and urinalysis.    Urinalysis shows bacteria none, WBC 21-50, leukocyte large, nitrite negative. TSH within normal limits Noncontrast head CT shows no acute intracranial abnormality. CMP unremarkable. CBC shows hemoconcentration, likely secondary to dehydration, patient has been given p.o. oral fluids. UDS, acetaminophen, salicylate level unremarkable.  Patient medically cleared at this time.  Per chart review patient taking amlodipine 5 mg once daily.   Home medication reordered.  TTS consult placed.  Attempted to contact patient's spouse, daughter, and daughter-in-law to update them on patient's condition, unable to reach any family member by phone.  Patient care was discussed with attending physician Dr. Darl Householder.  Final  Clinical Impression(s) / ED Diagnoses Final diagnoses:  None    Rx / DC Orders ED Discharge Orders     None        Dyann Ruddle 03/21/21 2031    Drenda Freeze, MD 03/21/21 2255

## 2021-03-22 NOTE — ED Notes (Signed)
Patient cooperative this shift.  No distress noted.

## 2021-03-22 NOTE — BH Assessment (Signed)
Comprehensive Clinical Assessment (CCA) Note  03/22/2021 [name] [id number]  Disposition: Prescott Gum, NP, patient meets inpatient criteria. Disposition SW to secure placement.   Chief Complaint:  Chief Complaint  Patient presents with   Psychiatric Evaluation   Visit Diagnosis:  Lindsay Schwartz is a 65 y.o. female with history of hypertension, CVA, TIA.  Presents to the emergency department under IVC by family member.  IVC paperwork reports that today patient stated that demons are trying to kill her, people watching her, and people are listening to her. Patient denied SI, HI and psychosis. During TTS assessment patient was poor historian and not able to answer multiple questions.    Per Chart, Patient is alert and oriented to person, place, and time.  Patient denies any physical complaints or injuries.  Patient reports that she is "going through spiritual warfare."  She states that "demons are attacking me and the devil is going to kill me."  Patient reports that this will "started with my mom getting sick, I have been going through this since then."  Reports that her mother was sick with COVID-19 in July 2022.  Reports increased stress due to mother's physical condition and care.  Patient denies any suicidal ideations, homicidal ideations, auditory hallucination, or visual hallucinations.  Denies any illicit drug use or alcohol use.  CCA Biopsychosocial Patient Reported Schizophrenia/Schizoaffective Diagnosis in Past: No data recorded  Strengths: uta  Mental Health Symptoms Depression:   Hopelessness; Fatigue; Change in energy/activity; Worthlessness   Duration of Depressive symptoms:  Duration of Depressive Symptoms: -- Pincus Badder)   Mania:   -- Pincus Badder)   Anxiety:   No data recorded  Psychosis:   Delusions   Duration of Psychotic symptoms:  Duration of Psychotic Symptoms: -- Pincus Badder)   Trauma:   -- Pincus Badder)   Obsessions:   -- Pincus Badder)   Compulsions:   -- Pincus Badder)    Inattention:   -- Pincus Badder)   Hyperactivity/Impulsivity:   -- Pincus Badder)   Oppositional/Defiant Behaviors:   -- Pincus Badder)   Emotional Irregularity:   -- Pincus Badder)   Other Mood/Personality Symptoms:  No data recorded   Mental Status Exam Appearance and self-care  Stature:   Average   Weight:   Average weight   Clothing:   Age-appropriate   Grooming:   Normal   Cosmetic use:   None   Posture/gait:   Normal   Motor activity:   Not Remarkable   Sensorium  Attention:  No data recorded  Concentration:   Preoccupied   Orientation:   -- Pincus Badder)   Recall/memory:   -- Pincus Badder)   Affect and Mood  Affect:   Flat   Mood:   Depressed; Hopeless   Relating  Eye contact:   Normal   Facial expression:   Sad; Depressed   Attitude toward examiner:   Cooperative   Thought and Language  Speech flow:  Slow; Soft   Thought content:   Delusions   Preoccupation:   -- Pincus Badder)   Hallucinations:   Other (Comment)   Organization:  No data recorded  [employer] of Knowledge:   -- Pincus Badder)   Intelligence:   -- Pincus Badder)   Abstraction:   -- Pincus Badder)   Judgement:  No data recorded  Reality Testing:  No data recorded  Insight:  No data recorded  Decision Making:   Confused   Social Functioning  Social Maturity:   -- Pincus Badder)   Social Judgement:   -- Pincus Badder)   Stress  Stressors:   -- (  Pincus Badder)   Coping Ability:   -- Pincus Badder)   Skill Deficits:   -- Pincus Badder)   Supports:   Family; Support needed    Religion: Religion/Spirituality Are You A Religious Person?:  Pincus Badder)  Leisure/Recreation: Leisure / Recreation Do You Have Hobbies?:  Pincus Badder)  Exercise/Diet: Exercise/Diet Do You Exercise?:  (uta) Have You Gained or Lost A Significant Amount of Weight in the Past Six Months?:  (uta) Do You Follow a Special Diet?:  (uta) Do You Have Any Trouble Sleeping?:  (uta)  CCA Employment/Education Employment/Work Situation: Employment / Work Situation Employment Situation:  On disability Why is Patient on Disability: uta How Long has Patient Been on Disability: uta Patient's Job has Been Impacted by Current Illness:  (uta) 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)  Education: Education Is Patient Currently Attending School?: No Last Grade Completed:  Pincus Badder) Did You Attend College?:  (uta) Did You Have An Individualized Education Program (IIEP):  Pincus Badder) Did You Have Any Difficulty At School?:  Pincus Badder) Patient's Education Has Been Impacted by Current Illness:  (uta)  CCA Family/Childhood History Family and Relationship History: Family history Marital status:  (uta) Does patient have children?:  (uta)  Childhood History:  Childhood History By whom was/is the patient raised?:  (uta) Did patient suffer any verbal/emotional/physical/sexual abuse as a child?: No Did patient suffer from severe childhood neglect?: No Has patient ever been sexually abused/assaulted/raped as an adolescent or adult?: No Was the patient ever a victim of a crime or a disaster?:  (uta) Witnessed domestic violence?:  (uta) Has patient been affected by domestic violence as an adult?:  Special educational needs teacher)  Child/Adolescent Assessment:   CCA Substance Use Alcohol/Drug Use: Alcohol / Drug Use Pain Medications: see MAR Prescriptions: see MAR Over the Counter: see MAR History of alcohol / drug use?: No history of alcohol / drug abuse   ASAM's:  Six Dimensions of Multidimensional Assessment  Dimension 1:  Acute Intoxication and/or Withdrawal Potential:      Dimension 2:  Biomedical Conditions and Complications:      Dimension 3:  Emotional, Behavioral, or Cognitive Conditions and Complications:     Dimension 4:  Readiness to Change:     Dimension 5:  Relapse, Continued use, or Continued Problem Potential:     Dimension 6:  Recovery/Living Environment:     ASAM Severity Score:    ASAM Recommended Level of Treatment:     Substance use Disorder (SUD)   Recommendations for  Services/Supports/Treatments: Recommendations for Services/Supports/Treatments Recommendations For Services/Supports/Treatments: Medication Management, Individual Therapy, Inpatient Hospitalization  Discharge Disposition:   DSM5 Diagnoses: Patient Active Problem List   Diagnosis Date Noted   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   S/P laparoscopic colectomy 09/16/2017   Polyp of sigmoid colon    HTN, goal below 140/80 07/07/2017   Hyperglycemia, unspecified 07/07/2017   History of CVA (cerebrovascular accident) 12/20/2015   Mixed hyperlipidemia 12/20/2015   Prehypertension 12/20/2015   CVA (cerebral infarction) 10/27/2015   Abnormal EKG 10/09/2015   Elevated systolic blood pressure reading without diagnosis of hypertension 10/09/2015   Referrals to Alternative Service(s): Referred to Alternative Service(s):   Place:   Date:   Time:    Referred to Alternative Service(s):   Place:   Date:   Time:    Referred to Alternative Service(s):   Place:   Date:   Time:    Referred to Alternative Service(s):   Place:   Date:   Time:  Venora Maples, [facility]

## 2021-03-22 NOTE — Progress Notes (Signed)
Per Naaman Plummer, pt has been accepted to [facility]. Accepting provider is Dr. Seward Speck. Patient can arrive anytime. Number for report is [phone]. Please fax IVC paperwork before calling report, fax#: [phone].    Glennie Isle, MSW, LCSW-A Phone: [phone] Disposition/TOC

## 2021-03-22 NOTE — ED Notes (Signed)
Pt D Cd off unit to facility per provider. Pt alert, cooperative, no s/s of distress. DC information and belongings given to sheriff for transport. Pt ambulatory off unit . Pt escorted and transported by sheriff.

## 2021-03-22 NOTE — Progress Notes (Signed)
CSW received a call from Celeste with [facility] in reference to this patient for review at there facility. Simone asked to speak with the patient's nurse. CSW provided a number/Phone#: [phone], to [facility] for nurse to be contacted.   Glennie Isle, MSW, [place], LCAS-A Phone: [phone] Disposition/TOC

## 2021-03-22 NOTE — ED Notes (Signed)
Pt alert this shift. Pt confused, anxious. Pt worried.  Pt reassured. Pt continues to say that she has done wrong and needs to be in jail. Denies SI and HI at this time.

## 2021-03-22 NOTE — Progress Notes (Signed)
Per Lindsay Schwartz, patient meets criteria for inpatient treatment. There are no available or appropriate beds at [facility] today. CSW faxed referrals to the following facilities for review:  [facility]  Pending - No Request Sent N/A [address] [place] [address] [phone] --  [facility]  Pending - No Request Sent N/A [address], [place] [address] [phone] --  [facility]  Pending - No Request [facility] [address] [place] [address] [phone] --  [facility] [address] [place] [address] [phone] --  [place]  Pending - No Request Sent N/A [address], [place] [address] [phone] --  [facility]  Pending - No Request Sent N/A [address] [place] [address] [phone] --  [facility]  Pending - No Request Sent N/A [address] [place], [place] [address] [phone] --  [facility]  Pending - No Request Sent N/A [address] [place] [address] [facility]  Pending - No Request Sent N/A [address] [place] [address] [phone] --  [facility]  Pending - No Request Sent N/A [address] [place] [address] [phone] --  [place]  Pending - No Request Sent N/A [address] [place] [address] [phone] --  [place]  Pending - No Request Sent N/A [address], [place] [address] [phone] --  [facility]  Pending - No Request Sent N/A [place], [place] [address] [facility]  Pending - No Request Sent N/A [address], [place] [address] [phone] --  [facility]  Pending - No Request Sent N/A [address] [place] [address] [phone] --    TTS will continue to seek bed placement.  Lindsay Schwartz, MSW, [place], LCAS-A Phone: [phone] Disposition/TOC

## 2021-07-08 DIAGNOSIS — R030 Elevated blood-pressure reading, without diagnosis of hypertension: Secondary | ICD-10-CM | POA: Diagnosis not present

## 2021-07-08 DIAGNOSIS — Z0000 Encounter for general adult medical examination without abnormal findings: Secondary | ICD-10-CM | POA: Diagnosis not present

## 2021-07-08 DIAGNOSIS — R739 Hyperglycemia, unspecified: Secondary | ICD-10-CM | POA: Diagnosis not present

## 2021-07-08 DIAGNOSIS — E782 Mixed hyperlipidemia: Secondary | ICD-10-CM | POA: Diagnosis not present

## 2021-07-10 DIAGNOSIS — S52124A Nondisplaced fracture of head of right radius, initial encounter for closed fracture: Secondary | ICD-10-CM | POA: Diagnosis not present

## 2021-07-10 DIAGNOSIS — M25521 Pain in right elbow: Secondary | ICD-10-CM | POA: Diagnosis not present

## 2021-07-10 DIAGNOSIS — M13811 Other specified arthritis, right shoulder: Secondary | ICD-10-CM | POA: Diagnosis not present

## 2021-07-10 DIAGNOSIS — M25511 Pain in right shoulder: Secondary | ICD-10-CM | POA: Diagnosis not present

## 2021-07-18 DIAGNOSIS — S52124A Nondisplaced fracture of head of right radius, initial encounter for closed fracture: Secondary | ICD-10-CM | POA: Diagnosis not present

## 2021-08-01 DIAGNOSIS — S52124A Nondisplaced fracture of head of right radius, initial encounter for closed fracture: Secondary | ICD-10-CM | POA: Diagnosis not present

## 2021-08-01 DIAGNOSIS — G5601 Carpal tunnel syndrome, right upper limb: Secondary | ICD-10-CM | POA: Diagnosis not present

## 2021-08-22 DIAGNOSIS — S52124A Nondisplaced fracture of head of right radius, initial encounter for closed fracture: Secondary | ICD-10-CM | POA: Diagnosis not present

## 2021-09-30 DIAGNOSIS — R739 Hyperglycemia, unspecified: Secondary | ICD-10-CM | POA: Diagnosis not present

## 2021-09-30 DIAGNOSIS — Z79899 Other long term (current) drug therapy: Secondary | ICD-10-CM | POA: Diagnosis not present

## 2021-09-30 DIAGNOSIS — R030 Elevated blood-pressure reading, without diagnosis of hypertension: Secondary | ICD-10-CM | POA: Diagnosis not present

## 2021-09-30 DIAGNOSIS — E782 Mixed hyperlipidemia: Secondary | ICD-10-CM | POA: Diagnosis not present

## 2021-10-07 DIAGNOSIS — Z79899 Other long term (current) drug therapy: Secondary | ICD-10-CM | POA: Diagnosis not present

## 2021-10-07 DIAGNOSIS — I10 Essential (primary) hypertension: Secondary | ICD-10-CM | POA: Diagnosis not present

## 2021-10-07 DIAGNOSIS — Z1231 Encounter for screening mammogram for malignant neoplasm of breast: Secondary | ICD-10-CM | POA: Diagnosis not present

## 2021-10-07 DIAGNOSIS — E782 Mixed hyperlipidemia: Secondary | ICD-10-CM | POA: Diagnosis not present

## 2021-10-07 DIAGNOSIS — R739 Hyperglycemia, unspecified: Secondary | ICD-10-CM | POA: Diagnosis not present

## 2021-10-08 ENCOUNTER — Other Ambulatory Visit: Payer: Self-pay | Admitting: Internal Medicine

## 2021-10-08 DIAGNOSIS — Z1231 Encounter for screening mammogram for malignant neoplasm of breast: Secondary | ICD-10-CM

## 2022-02-16 ENCOUNTER — Other Ambulatory Visit: Payer: Self-pay | Admitting: Internal Medicine

## 2022-02-16 DIAGNOSIS — J849 Interstitial pulmonary disease, unspecified: Secondary | ICD-10-CM

## 2022-02-16 DIAGNOSIS — Z0000 Encounter for general adult medical examination without abnormal findings: Secondary | ICD-10-CM

## 2022-04-08 IMAGING — CT CT HEAD W/O CM
3 series · 16 of 47 positions shown, 19 images · non-contrast
Comparison: 10/27/2015

CLINICAL DATA: Altered mental status

EXAM:
CT HEAD WITHOUT CONTRAST
TECHNIQUE: Contiguous axial images were obtained from the base of the skull
through the vertex without intravenous contrast.

[Series 2: head wo · axial · 0.41mm/px · z∈[+1352,+1487]mm · 10 of 33 slices shown, 13 images]
[im 3/33  brain]
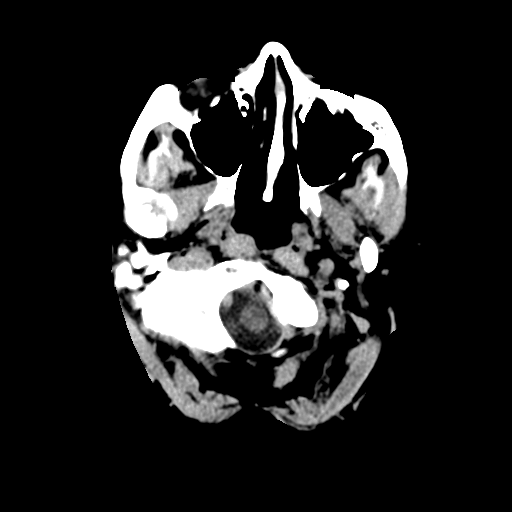
[im 3/33  bone]
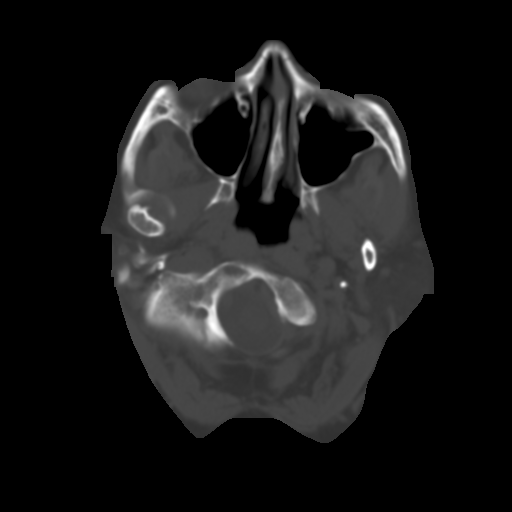
[im 6/33  brain]
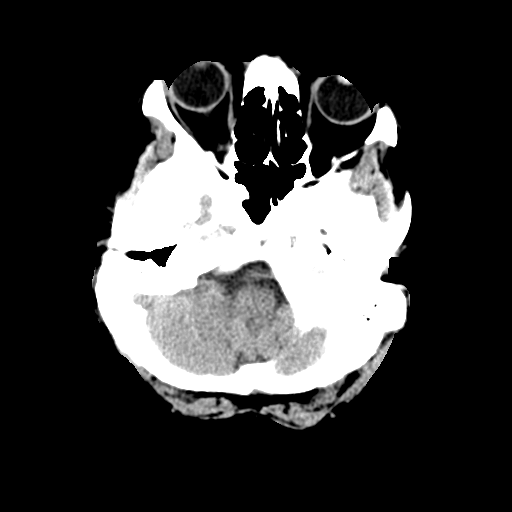
[im 9/33  brain]
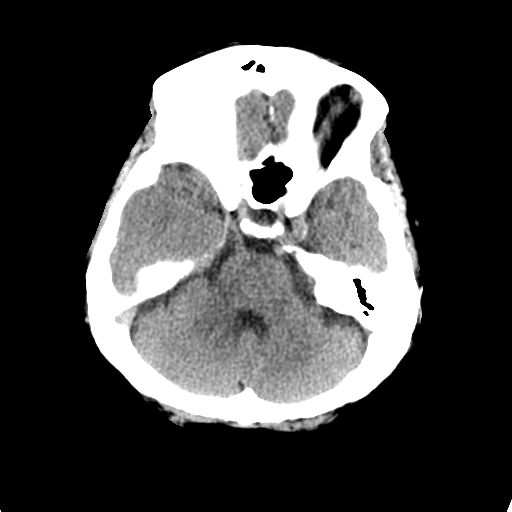
[im 12/33  brain]
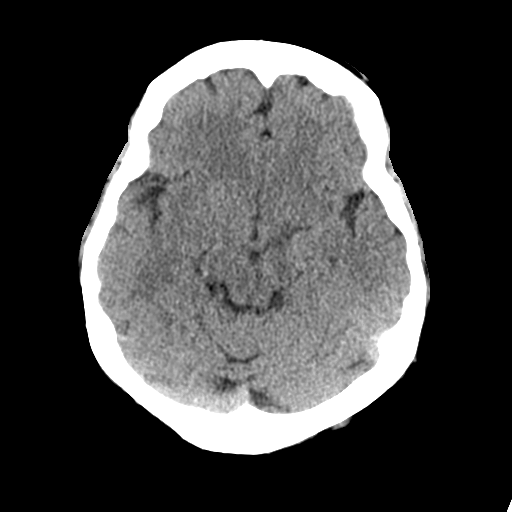
[im 15/33  brain]
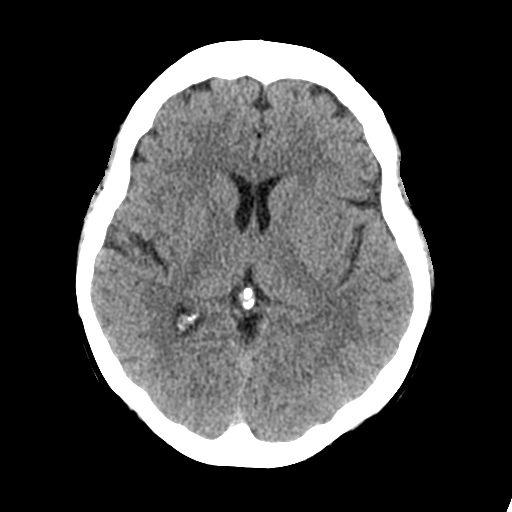
[im 15/33  bone]
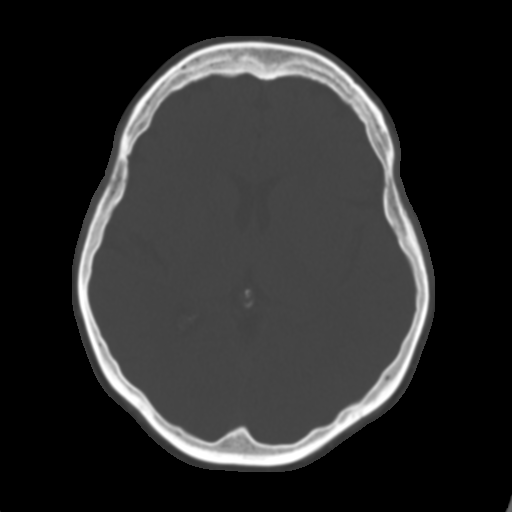
[im 18/33  brain]
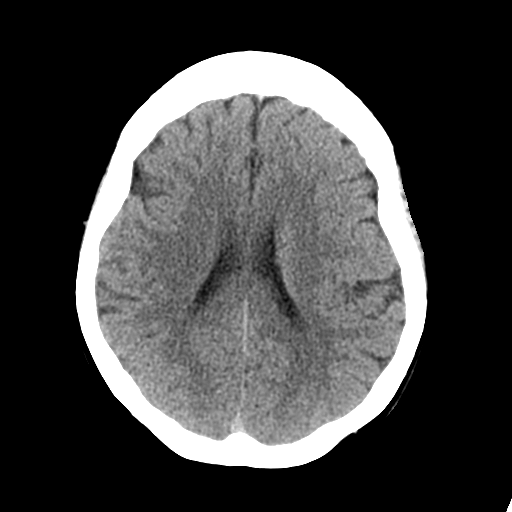
[im 21/33  brain]
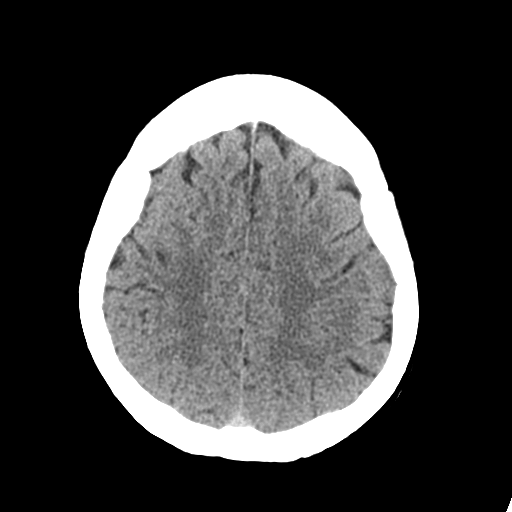
[im 25/33  brain]
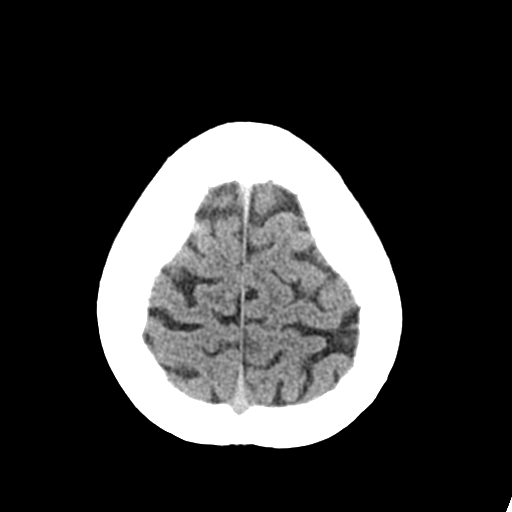
[im 27/33  brain]
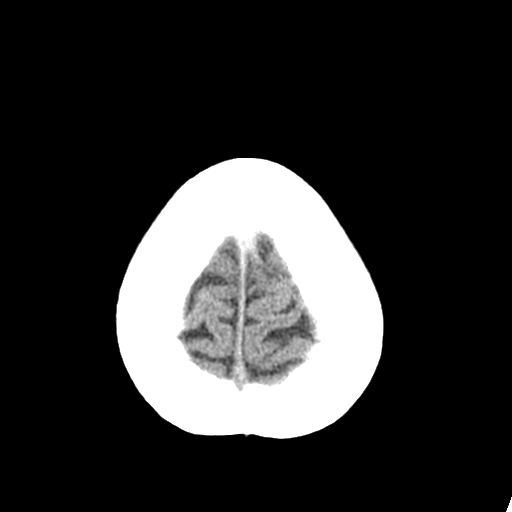
[im 27/33  bone]
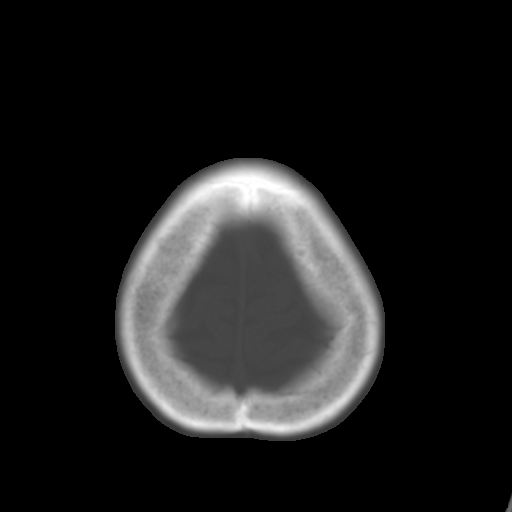
[im 30/33  brain]
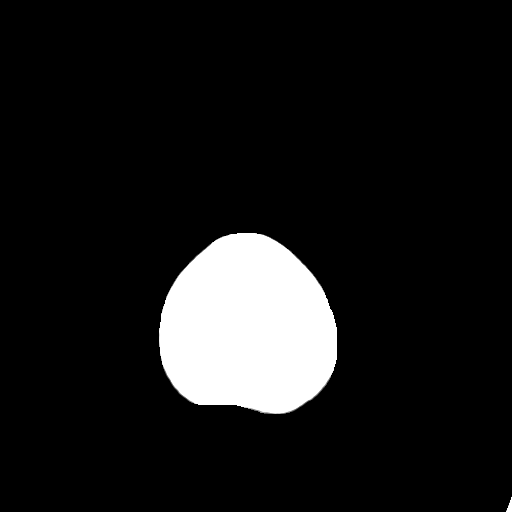

[Series 5: coronal soft tissue · coronal · 0.32mm/px · 3 of 67 slices shown]
[im 23/67  brain]
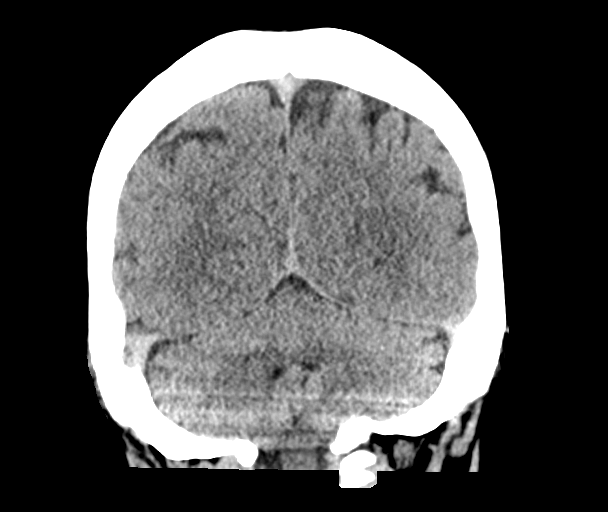
[im 30/67  brain]
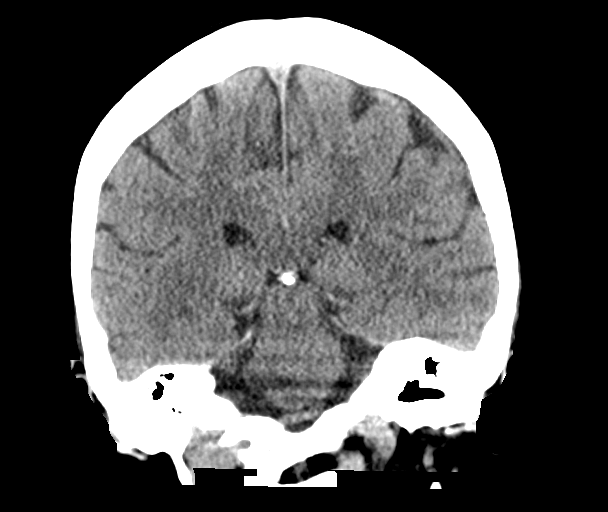
[im 37/67  brain]
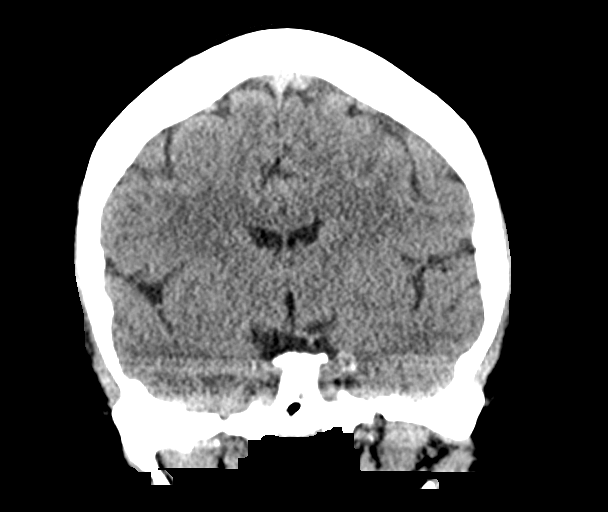

[Series 6: sagittal soft tissue · sagittal · 0.33mm/px · 3 of 61 slices shown]
[im 21/61  brain]
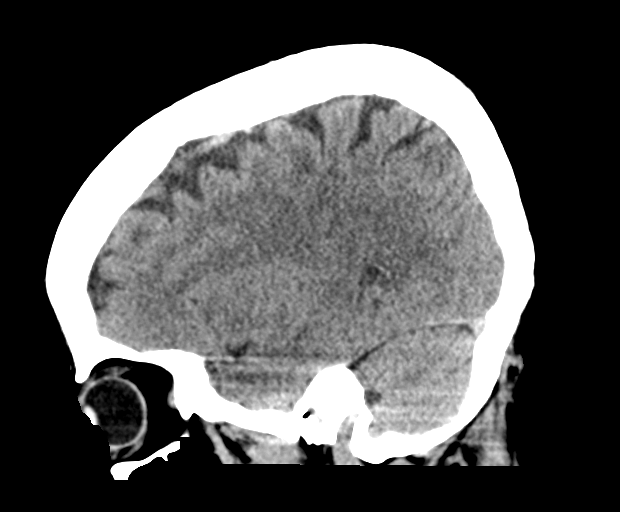
[im 31/61  brain]
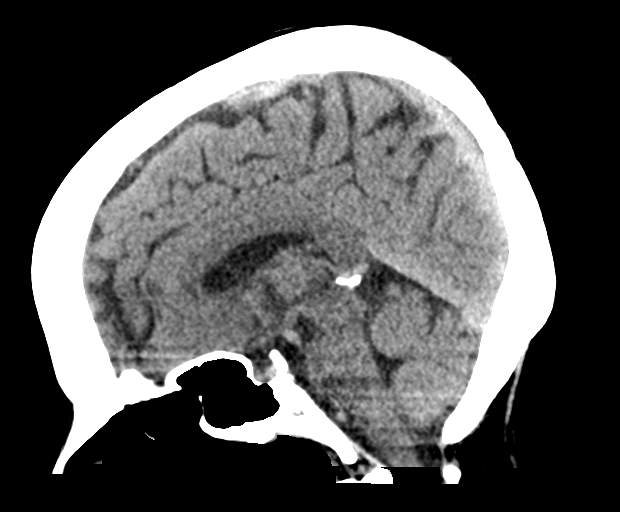
[im 41/61  brain]
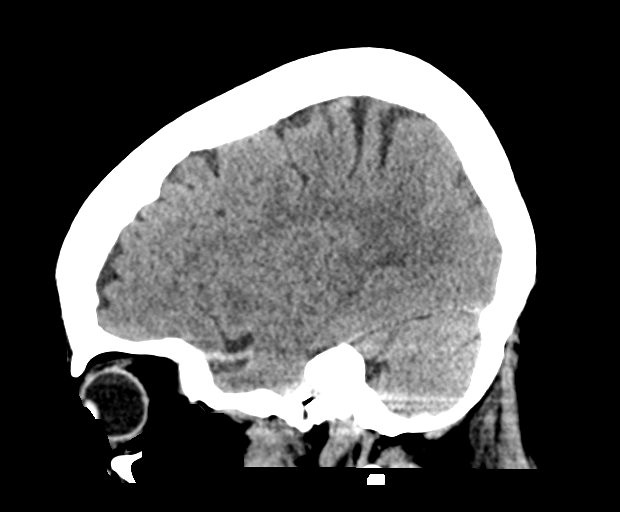

[16 of 47 positions shown; findings below may reference images not displayed]

FINDINGS: Brain: No evidence of acute infarction, hemorrhage, hydrocephalus,
extra-axial collection or mass lesion/mass effect.

Vascular: No hyperdense vessel or unexpected calcification.

Skull: Normal. Negative for fracture or focal lesion.

Sinuses/Orbits: No acute finding.

Other: None.
IMPRESSION: No acute intracranial abnormality noted.

## 2022-11-27 ENCOUNTER — Other Ambulatory Visit: Payer: Self-pay | Admitting: Internal Medicine

## 2022-11-27 DIAGNOSIS — Z0000 Encounter for general adult medical examination without abnormal findings: Secondary | ICD-10-CM

## 2022-11-27 DIAGNOSIS — R1032 Left lower quadrant pain: Secondary | ICD-10-CM

## 2022-11-27 DIAGNOSIS — C7A090 Malignant carcinoid tumor of the bronchus and lung: Secondary | ICD-10-CM

## 2022-12-04 ENCOUNTER — Ambulatory Visit
Admission: RE | Admit: 2022-12-04 | Discharge: 2022-12-04 | Disposition: A | Discharge status: Home / Self Care | Payer: Medicare Other | Source: Ambulatory Visit | Attending: Internal Medicine | Admitting: Internal Medicine

## 2022-12-04 DIAGNOSIS — Z0000 Encounter for general adult medical examination without abnormal findings: Secondary | ICD-10-CM | POA: Insufficient documentation

## 2022-12-04 DIAGNOSIS — C7A090 Malignant carcinoid tumor of the bronchus and lung: Secondary | ICD-10-CM | POA: Insufficient documentation

## 2022-12-04 MED ORDER — IOHEXOL 300 MG/ML  SOLN
100.0000 mL | Freq: Once | INTRAMUSCULAR | Status: AC | PRN
  Administered 2022-12-04: 100 mL via INTRAVENOUS

## 2023-12-02 ENCOUNTER — Other Ambulatory Visit: Payer: Self-pay | Admitting: Internal Medicine

## 2023-12-02 DIAGNOSIS — Z1231 Encounter for screening mammogram for malignant neoplasm of breast: Secondary | ICD-10-CM
